# Patient Record
Sex: Female | Born: 1945 | Race: White | Hispanic: No | Marital: Single | Smoking: Former smoker
Health system: Southern US, Community
[De-identification: ages and names within clinical notes are randomized; demographics above are authoritative.]

## PROBLEM LIST (undated history)

## (undated) DIAGNOSIS — J45909 Unspecified asthma, uncomplicated: Secondary | ICD-10-CM

## (undated) DIAGNOSIS — Z789 Other specified health status: Secondary | ICD-10-CM

## (undated) DIAGNOSIS — Z973 Presence of spectacles and contact lenses: Secondary | ICD-10-CM

## (undated) DIAGNOSIS — Z923 Personal history of irradiation: Secondary | ICD-10-CM

## (undated) DIAGNOSIS — I499 Cardiac arrhythmia, unspecified: Secondary | ICD-10-CM

## (undated) DIAGNOSIS — M1990 Unspecified osteoarthritis, unspecified site: Secondary | ICD-10-CM

## (undated) DIAGNOSIS — R232 Flushing: Secondary | ICD-10-CM

## (undated) DIAGNOSIS — T7840XA Allergy, unspecified, initial encounter: Secondary | ICD-10-CM

## (undated) HISTORY — PX: COLONOSCOPY: SHX174

## (undated) HISTORY — PX: BREAST EXCISIONAL BIOPSY: SUR124

## (undated) HISTORY — PX: APPENDECTOMY: SHX54

## (undated) HISTORY — DX: Personal history of irradiation: Z92.3

## (undated) HISTORY — PX: TONSILLECTOMY AND ADENOIDECTOMY: SHX28

---
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
## 1996-01-31 HISTORY — PX: ABDOMINAL HYSTERECTOMY: SHX81

## 1998-09-23 ENCOUNTER — Encounter (INDEPENDENT_AMBULATORY_CARE_PROVIDER_SITE_OTHER): Payer: Self-pay | Admitting: Specialist

## 1998-09-23 ENCOUNTER — Other Ambulatory Visit: Admission: RE | Admit: 1998-09-23 | Discharge: 1998-09-23 | Payer: Self-pay | Admitting: Internal Medicine

## 1999-03-18 ENCOUNTER — Other Ambulatory Visit: Admission: RE | Admit: 1999-03-18 | Discharge: 1999-03-18 | Payer: Self-pay | Admitting: Obstetrics & Gynecology

## 2000-04-09 ENCOUNTER — Other Ambulatory Visit: Admission: RE | Admit: 2000-04-09 | Discharge: 2000-04-09 | Payer: Self-pay | Admitting: Obstetrics & Gynecology

## 2001-06-26 ENCOUNTER — Other Ambulatory Visit: Admission: RE | Admit: 2001-06-26 | Discharge: 2001-06-26 | Payer: Self-pay | Admitting: Obstetrics & Gynecology

## 2001-06-26 ENCOUNTER — Encounter: Admission: RE | Admit: 2001-06-26 | Discharge: 2001-06-26 | Payer: Self-pay | Admitting: Obstetrics & Gynecology

## 2001-06-26 ENCOUNTER — Encounter: Payer: Self-pay | Admitting: Obstetrics & Gynecology

## 2002-06-27 ENCOUNTER — Other Ambulatory Visit: Admission: RE | Admit: 2002-06-27 | Discharge: 2002-06-27 | Payer: Self-pay | Admitting: Obstetrics & Gynecology

## 2003-04-07 ENCOUNTER — Ambulatory Visit (HOSPITAL_COMMUNITY): Admission: RE | Admit: 2003-04-07 | Discharge: 2003-04-07 | Payer: Self-pay | Admitting: Obstetrics & Gynecology

## 2003-05-21 ENCOUNTER — Encounter (INDEPENDENT_AMBULATORY_CARE_PROVIDER_SITE_OTHER): Payer: Self-pay | Admitting: *Deleted

## 2003-05-21 ENCOUNTER — Ambulatory Visit (HOSPITAL_BASED_OUTPATIENT_CLINIC_OR_DEPARTMENT_OTHER): Admission: RE | Admit: 2003-05-21 | Discharge: 2003-05-21 | Payer: Self-pay | Admitting: Plastic Surgery

## 2004-05-13 ENCOUNTER — Ambulatory Visit (HOSPITAL_COMMUNITY): Admission: RE | Admit: 2004-05-13 | Discharge: 2004-05-13 | Payer: Self-pay | Admitting: Obstetrics & Gynecology

## 2005-01-10 ENCOUNTER — Ambulatory Visit: Payer: Self-pay | Admitting: Internal Medicine

## 2005-01-16 ENCOUNTER — Ambulatory Visit: Payer: Self-pay | Admitting: Internal Medicine

## 2005-06-02 ENCOUNTER — Ambulatory Visit (HOSPITAL_COMMUNITY): Admission: RE | Admit: 2005-06-02 | Discharge: 2005-06-02 | Payer: Self-pay | Admitting: Obstetrics & Gynecology

## 2006-02-16 ENCOUNTER — Ambulatory Visit: Payer: Self-pay | Admitting: Internal Medicine

## 2006-02-16 LAB — CONVERTED CEMR LAB
ALT: 29 units/L (ref 0–40)
BUN: 10 mg/dL (ref 6–23)
Basophils Absolute: 0.1 10*3/uL (ref 0.0–0.1)
Cholesterol: 213 mg/dL (ref 0–200)
Creatinine, Ser: 0.8 mg/dL (ref 0.4–1.2)
Direct LDL: 139.4 mg/dL
Eosinophils Relative: 2.5 % (ref 0.0–5.0)
Free T4: 0.8 ng/dL (ref 0.6–1.6)
GFR calc non Af Amer: 78 mL/min
Hgb A1c MFr Bld: 5.6 % (ref 4.6–6.0)
Monocytes Absolute: 0.5 10*3/uL (ref 0.2–0.7)
Monocytes Relative: 7.8 % (ref 3.0–11.0)
Neutro Abs: 3.2 10*3/uL (ref 1.4–7.7)
Neutrophils Relative %: 52.7 % (ref 43.0–77.0)
Platelets: 327 10*3/uL (ref 150–400)
Potassium: 4 meq/L (ref 3.5–5.1)
RDW: 12.6 % (ref 11.5–14.6)
T3, Free: 3 pg/mL (ref 2.3–4.2)
Total Bilirubin: 0.8 mg/dL (ref 0.3–1.2)
Total Protein: 7 g/dL (ref 6.0–8.3)
Triglycerides: 181 mg/dL — ABNORMAL HIGH (ref 0–149)
VLDL: 36 mg/dL (ref 0–40)
Vit D, 1,25-Dihydroxy: 17 — ABNORMAL LOW (ref 20–57)

## 2006-06-05 ENCOUNTER — Ambulatory Visit (HOSPITAL_COMMUNITY): Admission: RE | Admit: 2006-06-05 | Discharge: 2006-06-05 | Payer: Self-pay | Admitting: Internal Medicine

## 2006-06-11 ENCOUNTER — Emergency Department (HOSPITAL_COMMUNITY): Admission: EM | Admit: 2006-06-11 | Discharge: 2006-06-11 | Payer: Self-pay | Admitting: Emergency Medicine

## 2006-06-11 DIAGNOSIS — E7800 Pure hypercholesterolemia, unspecified: Secondary | ICD-10-CM

## 2006-06-11 DIAGNOSIS — Z8679 Personal history of other diseases of the circulatory system: Secondary | ICD-10-CM | POA: Insufficient documentation

## 2006-06-11 DIAGNOSIS — I7300 Raynaud's syndrome without gangrene: Secondary | ICD-10-CM | POA: Insufficient documentation

## 2006-06-11 DIAGNOSIS — M1990 Unspecified osteoarthritis, unspecified site: Secondary | ICD-10-CM | POA: Insufficient documentation

## 2006-06-13 ENCOUNTER — Encounter: Payer: Self-pay | Admitting: Internal Medicine

## 2006-06-13 ENCOUNTER — Ambulatory Visit: Payer: Self-pay | Admitting: Internal Medicine

## 2007-01-10 ENCOUNTER — Ambulatory Visit: Payer: Self-pay | Admitting: Internal Medicine

## 2007-06-17 ENCOUNTER — Ambulatory Visit (HOSPITAL_COMMUNITY): Admission: RE | Admit: 2007-06-17 | Discharge: 2007-06-17 | Payer: Self-pay | Admitting: Internal Medicine

## 2007-07-01 ENCOUNTER — Ambulatory Visit: Payer: Self-pay | Admitting: Internal Medicine

## 2008-01-31 HISTORY — PX: BREAST SURGERY: SHX581

## 2008-05-18 ENCOUNTER — Ambulatory Visit: Payer: Self-pay | Admitting: Internal Medicine

## 2008-05-18 DIAGNOSIS — H9190 Unspecified hearing loss, unspecified ear: Secondary | ICD-10-CM | POA: Insufficient documentation

## 2008-05-18 DIAGNOSIS — R42 Dizziness and giddiness: Secondary | ICD-10-CM | POA: Insufficient documentation

## 2008-05-19 ENCOUNTER — Encounter: Payer: Self-pay | Admitting: Internal Medicine

## 2008-06-18 ENCOUNTER — Ambulatory Visit (HOSPITAL_COMMUNITY): Admission: RE | Admit: 2008-06-18 | Discharge: 2008-06-18 | Payer: Self-pay | Admitting: Internal Medicine

## 2008-06-24 ENCOUNTER — Telehealth (INDEPENDENT_AMBULATORY_CARE_PROVIDER_SITE_OTHER): Payer: Self-pay | Admitting: *Deleted

## 2008-06-24 ENCOUNTER — Encounter: Admission: RE | Admit: 2008-06-24 | Discharge: 2008-06-24 | Payer: Self-pay | Admitting: Internal Medicine

## 2008-06-25 ENCOUNTER — Encounter: Payer: Self-pay | Admitting: Internal Medicine

## 2008-06-26 ENCOUNTER — Encounter: Payer: Self-pay | Admitting: Internal Medicine

## 2008-06-26 ENCOUNTER — Encounter: Admission: RE | Admit: 2008-06-26 | Discharge: 2008-06-26 | Payer: Self-pay | Admitting: Internal Medicine

## 2008-07-22 ENCOUNTER — Ambulatory Visit: Payer: Self-pay | Admitting: Genetic Counselor

## 2008-12-21 ENCOUNTER — Encounter: Admission: RE | Admit: 2008-12-21 | Discharge: 2008-12-21 | Payer: Self-pay | Admitting: Surgery

## 2009-02-15 ENCOUNTER — Ambulatory Visit (HOSPITAL_COMMUNITY): Admission: RE | Admit: 2009-02-15 | Discharge: 2009-02-15 | Payer: Self-pay | Admitting: Surgery

## 2009-02-15 ENCOUNTER — Encounter: Admission: RE | Admit: 2009-02-15 | Discharge: 2009-02-15 | Payer: Self-pay | Admitting: Surgery

## 2009-03-15 ENCOUNTER — Ambulatory Visit: Payer: Self-pay | Admitting: Family

## 2009-03-16 ENCOUNTER — Telehealth (INDEPENDENT_AMBULATORY_CARE_PROVIDER_SITE_OTHER): Payer: Self-pay | Admitting: *Deleted

## 2009-03-18 ENCOUNTER — Ambulatory Visit: Payer: Self-pay | Admitting: Internal Medicine

## 2009-03-18 DIAGNOSIS — J069 Acute upper respiratory infection, unspecified: Secondary | ICD-10-CM

## 2009-03-18 DIAGNOSIS — R946 Abnormal results of thyroid function studies: Secondary | ICD-10-CM | POA: Insufficient documentation

## 2009-03-18 DIAGNOSIS — R000 Tachycardia, unspecified: Secondary | ICD-10-CM

## 2009-03-22 LAB — CONVERTED CEMR LAB
Basophils Absolute: 0 10*3/uL (ref 0.0–0.1)
Eosinophils Absolute: 0.3 10*3/uL (ref 0.0–0.7)
HCT: 42.4 % (ref 36.0–46.0)
Neutro Abs: 5.5 10*3/uL (ref 1.4–7.7)
T3, Free: 2.7 pg/mL (ref 2.3–4.2)
TSH: 1.42 microintl units/mL (ref 0.35–5.50)
WBC: 9.8 10*3/uL (ref 4.5–10.5)

## 2010-03-01 NOTE — Progress Notes (Signed)
Summary: **TRIAGE**SEVERE HEADACHE  Phone Note Call from Patient Call back at Home Phone [phone]   Caller: Patient Call For: [name] MD Reason for Call: Talk to Nurse Complaint: Headache Summary of Call: PATIENT CALLING C/O OF MOST SEVERE HEADACHE OF HER LIFE.  WAS HERE YESTERDAY, 03-15-2009, AND SEEN BY MELISSA O'SULLIVAN FOR SINUS.  MELISSA FOUND PT TO HAVE A VERY FAST HEART RATE.  PATIENT NOT EXPERIENCING ANY NUMBNESS, TINGLING, FOR VISUAL CHANGES, BUT IS VERY CONCERNED.  PLEASE CALL PATIENT. Initial call taken by: Magdalen Spatz [facility],  March 16, 2009 1:47 PM  Follow-up for Phone Call        Noted phone note from yesterday.  I called patient to see how she is feeling.  She reports resolution of headache.  Denies neck stiffness, feeling  better.  Reports temp 99.0 yesterday.   I discussed antibiotics with Dr. Alwyn Ren, will plan to continue zithromax x 10 days total. Patient aware.  I advised patient to go to ER if neck stiffness or if severe HA.  She verbalized understanding.   Follow-up by: Lemont Fillers FNP,  March 17, 2009 8:20 AM  Additional Follow-up for Phone Call Additional follow up Details #1::        Chemira, could you please call patient and arrange a follow up visit for tomorrow with Dr. Alwyn Ren or myself?   Thanks Additional Follow-up by: Lemont Fillers FNP,  March 17, 2009 8:22 AM    Additional Follow-up for Phone Call Additional follow up Details #2::    Spoke with pt ov scheduled tomorrow with Dr Alwyn Ren .Kandice Hams  March 17, 2009 9:39 AM  Follow-up by: Kandice Hams,  March 17, 2009 9:39 AM  New/Updated Medications: ZITHROMAX 250 MG TABS (AZITHROMYCIN) one tablet by mouth daily x 5 more days after completion of z-pak. Prescriptions: ZITHROMAX 250 MG TABS (AZITHROMYCIN) one tablet by mouth daily x 5 more days after completion of z-pak.  #5 x 0   Entered by:   Lemont Fillers FNP   Authorized by:   [name] MD   Signed  by:   Lemont Fillers FNP on 03/17/2009   Method used:   Electronically to        [facility] # [address] (retail)       [address]       [place], [place]  [address]       Ph: [phone]       Fax: [phone]   RxID:   [id number]

## 2010-03-01 NOTE — Assessment & Plan Note (Signed)
Summary: followup headaches/alr   Vital Signs:  Patient profile:   64 year old female Weight:      159 pounds Temp:     99.0 degrees F oral Pulse rate:   124 / minute Resp:     15 per minute BP sitting:   126 / 70  (left arm) Cuff size:   large  Vitals Entered By: Shonna Chock (March 18, 2009 10:35 AM) CC: Follow-up visit: Discuss Headaches Comments REVIEWED MED LIST, PATIENT AGREED DOSE AND INSTRUCTION CORRECT    CC:  Follow-up visit: Discuss Headaches.  History of Present Illness: She returns for F/U of sinusitis ; she has congestion when supine. She uses Vick's in nose at bedtime .  She is unaware of tachycardia as noted 02/14 & today. Sudafed D/Ced 02/13; now on Robitussin CF.PMH of abnormal TFTs .  Allergies: 1)  ! Pcn 2)  ! Sulfa  Review of Systems General:  Complains of sweats; denies chills and fever. ENT:  No frontal headache, facial pain or purulence. CV:  Denies chest pain or discomfort and palpitations. Resp:  Complains of cough; denies shortness of breath, sputum productive, and wheezing. GI:  Complains of diarrhea and loss of appetite; denies constipation; watery stool in afternoon  since Zpack. Derm:  Denies changes in nail beds, dryness, and hair loss. Neuro:  Denies numbness and tingling. Endo:  Complains of heat intolerance; denies cold intolerance.  Physical Exam  General:  well-nourished,in no acute distress; alert,appropriate and cooperative throughout examination Eyes:  No lid lag Ears:  External ear exam shows no significant lesions or deformities.  Otoscopic examination reveals clear canals, tympanic membranes are intact bilaterally without bulging, retraction, inflammation or discharge. Hearing is grossly normal bilaterally. Nose:  External nasal examination shows no deformity or inflammation. Nasal mucosa are pink and moist without lesions or exudates. Mouth:  Oral mucosa and oropharynx without lesions or exudates.  Teeth in good repair.  Slightly hoarse Neck:  No deformities, masses, or tenderness noted. Lungs:  Normal respiratory effort, chest expands symmetrically. Lungs are clear to auscultation, no crackles or wheezes. Heart:  Normal rate and regular rhythm. S1 and S2 normal without gallop, murmur, click, rub . S4 with pulse 90-102 Neurologic:  DTRs symmetrical and normal.  Fine tremor. Skin:  Intact without suspicious lesions or rashes Cervical Nodes:  No lymphadenopathy noted Axillary Nodes:  No palpable lymphadenopathy Psych:  memory intact for recent and remote, normally interactive, good eye contact, and not anxious appearing.     Impression & Recommendations:  Problem # 1:  URI (ICD-465.9)   Nasal congestion when supine   Orders: TLB-CBC Platelet - w/Differential (85025-CBCD)  Her updated medication list for this problem includes:    Promethegan 25 Mg Supp (Promethazine hcl) .Marland Kitchen... 1 q 6 -8 hrs as needed  Problem # 2:  TACHYCARDIA (ICD-785.0)  mild, ? drug induced  Orders: Venipuncture (29562)  Problem # 3:  THYROID FUNCTION TEST, ABNORMAL (ICD-794.5)  Orders: Venipuncture (13086) TLB-TSH (Thyroid Stimulating Hormone) (84443-TSH) TLB-T3, Free (Triiodothyronine) (84481-T3FREE)  Problem # 4:  DIARRHEA (ICD-787.91)  Orders: Venipuncture (57846) TLB-CBC Platelet - w/Differential (85025-CBCD)  Complete Medication List: 1)  Zithromax Z-pak 250 Mg Tabs (Azithromycin) .... Two tabs by mouth x 1 today, then one tablet by mouth daily for 4 more days 2)  Zithromax 250 Mg Tabs (Azithromycin) .... One tablet by mouth daily x 5 more days after completion of z-pak. 3)  Fluticasone Propionate 50 Mcg/act Susp (Fluticasone propionate) .Marland Kitchen.. 1 spray two times  a day as "crossover " technique 4)  Meclizine Hcl 25 Mg Tabs (Meclizine hcl) .Marland Kitchen.. 1 q 6-8 hrs as needed 5)  Promethegan 25 Mg Supp (Promethazine hcl) .Marland Kitchen.. 1 q 6 -8 hrs as needed  Patient Instructions: 1)  Neti pot once daily until sinuses  are clear. 2)   Drink as much fluid as you can tolerate for the next few days. Nasal sray two times a day as Rxed. Stop Vicks @ night. Align once daily until bowels are normal. 3)  Medically cleared to return to work 03/19/2009; return to work note provided. Prescriptions: PROMETHEGAN 25 MG SUPP (PROMETHAZINE HCL) 1 q 6 -8 hrs as needed  #6 x 0   Entered and Authorized by:   [name] MD   Signed by:   [name] MD on 03/18/2009   Method used:   Print then Give to Patient   RxID:   [id number] MECLIZINE HCL 25 MG TABS (MECLIZINE HCL) 1 q 6-8 hrs as needed  #30 x 0   Entered and Authorized by:   [name] MD   Signed by:   [name] MD on 03/18/2009   Method used:   Print then Give to Patient   RxID:   [id number] FLUTICASONE PROPIONATE 50 MCG/ACT SUSP (FLUTICASONE PROPIONATE) 1 spray two times a day as "crossover " technique  #1 x 5   Entered and Authorized by:   [name] MD   Signed by:   [name] MD on 03/18/2009   Method used:   Faxed to ...       [facility] # [address]* (retail)       [address]       [place], [place]  [address]       Ph: [phone]       Fax: [phone]   RxID:   [id number]

## 2010-03-01 NOTE — Assessment & Plan Note (Signed)
Summary: ? SINUS INF/RH.......Marland Kitchen   Vital Signs:  Patient profile:   65 year old female Weight:      159 pounds Temp:     99.1 degrees F oral BP sitting:   112 / 70  (left arm)  Vitals Entered By: Doristine Devoid (March 15, 2009 11:22 AM) CC: sinus infection? having some facial pain and pressure has taken sudafed w/ little relief    CC:  sinus infection? having some facial pain and pressure has taken sudafed w/ little relief .  History of Present Illness: Ms Beste is a 65 year old female who present with c/o HA on Thursday.  Friday she woke with bad sore throat.  Notes that Yesterday her throat was so sore, felt like it was going to close up on me.  Has been doing warm water gargles. Sore throat is gone.  Notes HA is almost constant and is localized to the frontal sinus region.  Notes + creme colored nasal discharge.  Notes low grade fever.  She feels that her pain is greatest on the left side of her face.    Allergies: 1)  ! Pcn 2)  ! Sulfa  Physical Exam  General:  Well-developed,well-nourished,in no acute distress; alert,appropriate and cooperative throughout examination Head:  Normocephalic and atraumatic without obvious abnormalities. No apparent alopecia or balding. Eyes:  PERRLA Ears:  External ear exam shows no significant lesions or deformities.  Otoscopic examination reveals clear canals, tympanic membranes are intact bilaterally without bulging, retraction, inflammation or discharge. Hearing is grossly normal bilaterally. Mouth:  mild pharyngeal erythema without exudate.  No narrowing of posterior pharynx noted.   Neck:  No deformities, masses, or tenderness noted. Lungs:  Normal respiratory effort, chest expands symmetrically. Lungs are clear to auscultation, no crackles or wheezes. Heart:  + mild tachycardia, (heart rate 124), s1/s2, regular rythm Cervical Nodes:  No lymphadenopathy noted   Impression & Recommendations:  Problem # 1:  SINUSITIS  (ICD-473.9) Assessment New Will plan to treat with zithromax.  Patient with low grade temperature and mild tachycardia.  Rapid flu is negative. Lung exam benign.  Will need close follow up.    I have instructed patient to call if you develop fever over 101, increasing sinus pressure, pain with eye movement, increased facial tenderness of swelling, or if  visual changes.  She verbalizes understanding.  Follow up in 1 week- sooner if needed.    Her updated medication list for this problem includes:    Zithromax Z-pak 250 Mg Tabs (Azithromycin) .Marland Kitchen..Marland Kitchen Two tabs by mouth x 1 today, then one tablet by mouth daily for 4 more days  Complete Medication List: 1)  Sudafed  .... Prn 2)  Zithromax Z-pak 250 Mg Tabs (Azithromycin) .... Two tabs by mouth x 1 today, then one tablet by mouth daily for 4 more days  Patient Instructions: 1)   Call if you develop fever over 101, increasing sinus pressure, pain with eye movement, increased facial tenderness of swelling, or if you develop visual changes. 2)  Follow up in 1 week, sooner if problems or concerns.   Prescriptions: ZITHROMAX Z-PAK 250 MG TABS (AZITHROMYCIN) two tabs by mouth x 1 today, then one tablet by mouth daily for 4 more days  #1 pack x 0   Entered and Authorized by:   Lemont Fillers FNP   Signed by:   Lemont Fillers FNP on 03/15/2009   Method used:   Electronically to        [facility] # [address] (retail)       [address]       [place], [place]  [address]       Ph: [phone]       Fax: [phone]   RxID:   [id number]   Laboratory Results    Other Tests  Influenza A: negative

## 2010-04-04 ENCOUNTER — Other Ambulatory Visit: Payer: Self-pay | Admitting: Internal Medicine

## 2010-04-04 DIAGNOSIS — Z1231 Encounter for screening mammogram for malignant neoplasm of breast: Secondary | ICD-10-CM

## 2010-04-17 LAB — DIFFERENTIAL
Basophils Absolute: 0 10*3/uL (ref 0.0–0.1)
Basophils Relative: 0 % (ref 0–1)
Eosinophils Absolute: 0.2 10*3/uL (ref 0.0–0.7)
Lymphs Abs: 3.2 10*3/uL (ref 0.7–4.0)
Monocytes Absolute: 0.6 10*3/uL (ref 0.1–1.0)
Monocytes Relative: 8 % (ref 3–12)

## 2010-04-17 LAB — CBC
MCHC: 35.3 g/dL (ref 30.0–36.0)
MCV: 93.6 fL (ref 78.0–100.0)
Platelets: 308 10*3/uL (ref 150–400)
RBC: 4.48 MIL/uL (ref 3.87–5.11)
RDW: 13.2 % (ref 11.5–15.5)
WBC: 7.9 10*3/uL (ref 4.0–10.5)

## 2010-04-17 LAB — BASIC METABOLIC PANEL
BUN: 12 mg/dL (ref 6–23)
Calcium: 10.7 mg/dL — ABNORMAL HIGH (ref 8.4–10.5)
GFR calc non Af Amer: >60 mL/min (ref >60)
Glucose, Bld: 100 mg/dL — ABNORMAL HIGH (ref 70–99)
Potassium: 4.6 mEq/L (ref 3.5–5.1)

## 2010-05-06 ENCOUNTER — Ambulatory Visit
Admission: RE | Admit: 2010-05-06 | Discharge: 2010-05-06 | Disposition: A | Discharge status: Home / Self Care | Payer: BC Managed Care – PPO | Source: Ambulatory Visit | Attending: Internal Medicine | Admitting: Internal Medicine

## 2010-05-06 DIAGNOSIS — Z1231 Encounter for screening mammogram for malignant neoplasm of breast: Secondary | ICD-10-CM

## 2011-01-02 ENCOUNTER — Telehealth: Payer: Self-pay | Admitting: Internal Medicine

## 2011-01-02 NOTE — Telephone Encounter (Signed)
Patient call with alt # to call [phone] --- # she gave was work #

## 2011-01-02 NOTE — Telephone Encounter (Signed)
Pt schedule to come in tomorrow.

## 2011-01-03 ENCOUNTER — Ambulatory Visit (INDEPENDENT_AMBULATORY_CARE_PROVIDER_SITE_OTHER): Payer: BC Managed Care – PPO | Admitting: Internal Medicine

## 2011-01-03 ENCOUNTER — Encounter: Payer: Self-pay | Admitting: Internal Medicine

## 2011-01-03 VITALS — BP 114/70 | HR 102 | Temp 98.4°F | Ht 62.75 in | Wt 166.4 lb

## 2011-01-03 DIAGNOSIS — H8110 Benign paroxysmal vertigo, unspecified ear: Secondary | ICD-10-CM

## 2011-01-03 DIAGNOSIS — G479 Sleep disorder, unspecified: Secondary | ICD-10-CM

## 2011-01-03 DIAGNOSIS — N951 Menopausal and female climacteric states: Secondary | ICD-10-CM

## 2011-01-03 DIAGNOSIS — H9209 Otalgia, unspecified ear: Secondary | ICD-10-CM

## 2011-01-03 DIAGNOSIS — H9201 Otalgia, right ear: Secondary | ICD-10-CM

## 2011-01-03 MED ORDER — NEOMYCIN-POLYMYXIN-HC 3.5-10000-1 OT SUSP: 3.0000 [drp] | Freq: Four times a day (QID) | OTIC | Status: AC

## 2011-01-03 MED ORDER — FLUTICASONE PROPIONATE 50 MCG/ACT NA SUSP: NASAL | Status: DC

## 2011-01-03 MED ORDER — CLONIDINE HCL 0.1 MG PO TABS: ORAL_TABLET | ORAL | Status: DC

## 2011-01-03 NOTE — Patient Instructions (Addendum)
To prevent sleep dysfunction follow these instructions for sleep hygiene. Do not read, watch TV, or eat in bed. Do not get into bed until you are ready to turn off the light &  to go to sleep. Do not ingest stimulants ( decongestants, diet pills, nicotine, caffeine) after the evening meal. The medication for flashes may cause dry mouth or dry eyes; this would resolve after 48-72 hours maximally . It also may lower the blood pressure slightly ; do isometric exercises before standing up at night to the bathroom for example.

## 2011-01-03 NOTE — Progress Notes (Signed)
  Subjective:    Patient ID: Kathleen Harris, female    DOB: 1945-03-22, 65 y.o.   MRN: [id number]  HPI Earache: Onset/symptoms:2 weeks ago she felt pressure in R ear Exposures (illness/environmental/extrinsic):water while shampooing hair Progression of symptoms:to vertiginous symptoms with standing up from waist flexion Treatments/response:none except shower cap Present symptoms: Fever/chills/sweats:only menopausal sweats Frontal headache:yes Facial pain:yes Nasal purulence:scant yellow Ear discharge: orange Sore throat:yes Dental pain:no Lymphadenopathy:no Wheezing/shortness of breath:no Cough/sputum/hemoptysis:no Past medical history: Seasonal allergies; yes /asthma:only as child Smoking history:quit 1973          Review of Systems   She has frequent awakenings related to hot flashes.     Objective:   Physical Exam General appearance is of good health and nourishment; no acute distress or increased work of breathing is present.  No  lymphadenopathy about the head, neck, or axilla noted.   Eyes: No conjunctival inflammation or lid edema is present. EOMI w/o nystagmus  Ears:  External ear exam shows no significant lesions or deformities.  Otoscopic examination reveals clear canals,R  tympanic membrane intact  without bulging, retraction, inflammation or discharge; but it is dull. L TM normal  Nose:  External nasal examination shows no deformity or inflammation. Nasal mucosa are pink and moist without lesions or exudates. No septal dislocation .No obstruction to airflow.   Oral exam: Dental hygiene is good; lips and gums are healthy appearing.There is no oropharyngeal erythema or exudate noted.   Heart:  Normal rate and regular rhythm. S2 accentuated  without gallop, murmur, click, rub or other extra sounds.   Lungs:Chest clear to auscultation; no wheezes, rhonchi,rales ,or rubs present.No increased work of breathing.    Extremities:  No cyanosis or clubbing  noted ;  trace edema   Skin: Warm & dry           Assessment & Plan:  #1 chronic recurrent otic dysfunction probably related to eustachian tube dysfunction. This is associated with benign positional vertigo symptoms.  #2 possible upper respiratory tract symptoms; rule out rhinosinusitis.  #3, menopausal related  sleep dysfunction  Plan: See orders and recommendations

## 2011-01-20 ENCOUNTER — Other Ambulatory Visit: Payer: Self-pay | Admitting: Internal Medicine

## 2011-04-04 ENCOUNTER — Other Ambulatory Visit: Payer: Self-pay | Admitting: Internal Medicine

## 2011-04-04 DIAGNOSIS — Z1231 Encounter for screening mammogram for malignant neoplasm of breast: Secondary | ICD-10-CM

## 2011-05-19 ENCOUNTER — Ambulatory Visit
Admission: RE | Admit: 2011-05-19 | Discharge: 2011-05-19 | Disposition: A | Discharge status: Home / Self Care | Payer: BC Managed Care – PPO | Source: Ambulatory Visit | Attending: Internal Medicine | Admitting: Internal Medicine

## 2011-05-19 DIAGNOSIS — Z1231 Encounter for screening mammogram for malignant neoplasm of breast: Secondary | ICD-10-CM

## 2011-11-22 ENCOUNTER — Encounter: Payer: BC Managed Care – PPO | Admitting: Internal Medicine

## 2011-11-29 ENCOUNTER — Ambulatory Visit: Payer: BC Managed Care – PPO | Admitting: Internal Medicine

## 2011-12-26 ENCOUNTER — Encounter: Payer: BC Managed Care – PPO | Admitting: Internal Medicine

## 2012-01-31 DIAGNOSIS — Z923 Personal history of irradiation: Secondary | ICD-10-CM

## 2012-01-31 DIAGNOSIS — C50919 Malignant neoplasm of unspecified site of unspecified female breast: Secondary | ICD-10-CM

## 2012-01-31 HISTORY — DX: Malignant neoplasm of unspecified site of unspecified female breast: C50.919

## 2012-01-31 HISTORY — DX: Personal history of irradiation: Z92.3

## 2012-01-31 HISTORY — PX: BREAST LUMPECTOMY: SHX2

## 2012-04-17 ENCOUNTER — Other Ambulatory Visit: Payer: Self-pay

## 2012-04-17 DIAGNOSIS — Z1231 Encounter for screening mammogram for malignant neoplasm of breast: Secondary | ICD-10-CM

## 2012-05-21 ENCOUNTER — Ambulatory Visit
Admission: RE | Admit: 2012-05-21 | Discharge: 2012-05-21 | Disposition: A | Discharge status: Home / Self Care | Payer: BC Managed Care – PPO | Source: Ambulatory Visit

## 2012-05-21 DIAGNOSIS — Z1231 Encounter for screening mammogram for malignant neoplasm of breast: Secondary | ICD-10-CM

## 2012-05-22 ENCOUNTER — Other Ambulatory Visit (INDEPENDENT_AMBULATORY_CARE_PROVIDER_SITE_OTHER): Payer: Self-pay | Admitting: Surgery

## 2012-05-22 DIAGNOSIS — R928 Other abnormal and inconclusive findings on diagnostic imaging of breast: Secondary | ICD-10-CM

## 2012-06-03 ENCOUNTER — Other Ambulatory Visit: Payer: BC Managed Care – PPO

## 2012-06-04 ENCOUNTER — Other Ambulatory Visit (INDEPENDENT_AMBULATORY_CARE_PROVIDER_SITE_OTHER): Payer: Self-pay | Admitting: Surgery

## 2012-06-04 ENCOUNTER — Ambulatory Visit
Admission: RE | Admit: 2012-06-04 | Discharge: 2012-06-04 | Disposition: A | Discharge status: Home / Self Care | Payer: BC Managed Care – PPO | Source: Ambulatory Visit | Attending: Surgery | Admitting: Surgery

## 2012-06-04 DIAGNOSIS — R928 Other abnormal and inconclusive findings on diagnostic imaging of breast: Secondary | ICD-10-CM

## 2012-06-05 ENCOUNTER — Other Ambulatory Visit (INDEPENDENT_AMBULATORY_CARE_PROVIDER_SITE_OTHER): Payer: Self-pay | Admitting: Surgery

## 2012-06-05 ENCOUNTER — Ambulatory Visit
Admission: RE | Admit: 2012-06-05 | Discharge: 2012-06-05 | Disposition: A | Discharge status: Home / Self Care | Payer: BC Managed Care – PPO | Source: Ambulatory Visit | Attending: Surgery | Admitting: Surgery

## 2012-06-05 ENCOUNTER — Encounter: Payer: Self-pay | Admitting: Internal Medicine

## 2012-06-05 DIAGNOSIS — C50911 Malignant neoplasm of unspecified site of right female breast: Secondary | ICD-10-CM

## 2012-06-05 DIAGNOSIS — R928 Other abnormal and inconclusive findings on diagnostic imaging of breast: Secondary | ICD-10-CM

## 2012-06-10 ENCOUNTER — Ambulatory Visit
Admission: RE | Admit: 2012-06-10 | Discharge: 2012-06-10 | Disposition: A | Discharge status: Home / Self Care | Payer: BC Managed Care – PPO | Source: Ambulatory Visit | Attending: Surgery | Admitting: Surgery

## 2012-06-10 ENCOUNTER — Other Ambulatory Visit: Payer: BC Managed Care – PPO

## 2012-06-10 DIAGNOSIS — C50911 Malignant neoplasm of unspecified site of right female breast: Secondary | ICD-10-CM

## 2012-06-10 MED ORDER — GADOBENATE DIMEGLUMINE 529 MG/ML IV SOLN: 15.0000 mL | Freq: Once | INTRAVENOUS | Status: AC | PRN

## 2012-06-10 MED ORDER — GADOBENATE DIMEGLUMINE 529 MG/ML IV SOLN
15.0000 mL | Freq: Once | INTRAVENOUS | Status: AC | PRN
  Administered 2012-06-10: 15 mL via INTRAVENOUS

## 2012-06-13 ENCOUNTER — Telehealth (INDEPENDENT_AMBULATORY_CARE_PROVIDER_SITE_OTHER): Payer: Self-pay

## 2012-06-13 NOTE — Telephone Encounter (Signed)
Patient called in stating she had not heard back from our office regarding her appointment being moved up. She says the breast center told her I would call her and move her appointment up. I told her the breast center made a mistake in telling her that because Dr Luisa Hart is not in the office the rest of the week. She asked why we did not call her with MRI results. I told her that the breast center should have called her with those results because we did not order it so the results did not come to our in basket. I advised her to call the breast center for the results and Dr Luisa Hart would consult with her on Monday.

## 2012-06-17 ENCOUNTER — Encounter (INDEPENDENT_AMBULATORY_CARE_PROVIDER_SITE_OTHER): Payer: Self-pay | Admitting: Surgery

## 2012-06-17 ENCOUNTER — Ambulatory Visit (INDEPENDENT_AMBULATORY_CARE_PROVIDER_SITE_OTHER): Payer: BC Managed Care – PPO | Admitting: Surgery

## 2012-06-17 VITALS — BP 110/70 | HR 88 | Temp 97.8°F | Resp 18 | Ht 63.0 in | Wt 160.2 lb

## 2012-06-17 DIAGNOSIS — C50919 Malignant neoplasm of unspecified site of unspecified female breast: Secondary | ICD-10-CM

## 2012-06-17 DIAGNOSIS — C50911 Malignant neoplasm of unspecified site of right female breast: Secondary | ICD-10-CM

## 2012-06-17 NOTE — Patient Instructions (Signed)
Sentinel Lymph Node Biopsy Sentinel lymph node biopsy is a procedure in which a single lymph node is identified, removed, and examined for cancer. Lymph nodes are collections of tissue that help filter infections, cancer cells, and other waste substances from the bloodstream. Certain types of cancer can spread to nearby lymph nodes. The cancer spreads to one lymph node first, and then to others. The first lymph node that your cancer could spread to is called the sentinel lymph node. Examining the sentinel lymph node for cancer can help your caregiver plan future treatment for you. LET YOUR CAREGIVER KNOW ABOUT:   Allergies to food or medicine.  Medicines taken, including vitamins, herbs, eyedrops, over-the-counter medicines, and creams.  Use of steroids (by mouth or creams).  Previous problems with numbing medicines.  History of bleeding problems or blood clots.  Previous surgery.  Other health problems, including diabetes and kidney problems.  Possibility of pregnancy, if this applies. RISKS AND COMPLICATIONS   Infection.  Bleeding.  Allergic reaction to the dye used for the procedure.  Blue staining of the skin where the dye is injected.  Damaged lymph vessels, causing a buildup of fluid (lymphedema).  Pain or bruising at the biopsy site. BEFORE THE PROCEDURE   Stop smoking at least 2 weeks before the procedure. Not smoking will improve your health after the procedure and decrease the chance of getting a wound infection.  You may have blood tests to make sure your blood clots normally.  Ask your caregiver about changing or stopping your regular medicines.  Do not eat or drink anything for 8 hours before the procedure. PROCEDURE   You will be given medicine that makes you sleep (general anesthetic).  A blue, radioactive dye will be injected near the tumor. The dye will then spread into the sentinel lymph node.  A scanner will identify the sentinel lymph node.  A  small cut (incision) will be made, and the sentinel lymph node will be removed.  The sentinel lymph node will be examined in a lab. Sometimes, a sentinel lymph node biopsy is performed during another surgery, such as a mastectomy or lumpectomy for breast cancer.  AFTER THE PROCEDURE   You will go to a recovery room.  You will be monitored for several hours.  If complications do not occur, you will be allowed to go home a few hours after the procedure.  Your urine may be blue for the next 24 hours. This is normal. It is caused by the dye used during the procedure.  Your skin where the dye was injected may be blue for up to 8 weeks. Document Released: 04/10/2011 Document Reviewed: 04/10/2011 [facility] Patient Information 2013 [place], [place]. Lumpectomy, Breast Conserving Surgery A lumpectomy is breast surgery that removes only part of the breast. Another name used may be partial mastectomy. The amount removed varies. Make sure you understand how much of your breast will be removed. Reasons for a lumpectomy:  Any solid breast mass.  Grouped significant nodularity that may be confused with a solitary breast mass. Lumpectomy is the most common form of breast cancer surgery today. The surgeon removes the portion of your breast which contains the tumor (cancer). This is the lump. Some normal tissue around the lump is also removed to be sure that all the tumor has been removed.  If cancer cells are found in the margins where the breast tissue was removed, your surgeon will do more surgery to remove the remaining cancer tissue. This is called re-excision  surgery. Radiation and/or chemotherapy treatments are often given following a lumpectomy to kill any cancer cells that could possibly remain.  REASONS YOU MAY NOT BE ABLE TO HAVE BREAST CONSERVING SURGERY:  The tumor is located in more than one place.  Your breast is small and the tumor is large so the breast would be disfigured.  The entire  tumor removal is not successful with a lumpectomy.  You cannot commit to a full course of chemotherapy, radiation therapy or are pregnant and cannot have radiation.  You have previously had radiation to the breast to treat cancer. HOW A LUMPECTOMY IS PERFORMED If overnight nursing is not required following a biopsy, a lumpectomy can be performed as a same-day surgery. This can be done in a hospital, clinic, or surgical center. The anesthesia used will depend on your surgeon. They will discuss this with you. A general anesthetic keeps you sleeping through the procedure. LET YOUR CAREGIVERS KNOW ABOUT THE FOLLOWING:  Allergies  Medications taken including herbs, eye drops, over the counter medications, and creams.  Use of steroids (by mouth or creams)  Previous problems with anesthetics or Novocaine.  Possibility of pregnancy, if this applies  History of blood clots (thrombophlebitis)  History of bleeding or blood problems.  Previous surgery  Other health problems BEFORE THE PROCEDURE You should be present one hour prior to your procedure unless directed otherwise.  AFTER THE PROCEDURE  After surgery, you will be taken to the recovery area where a nurse will watch and check your progress. Once you're awake, stable, and taking fluids well, barring other problems you will be allowed to go home.  Ice packs applied to your operative site may help with discomfort and keep the swelling down.  A small rubber drain may be placed in the breast for a couple of days to prevent a hematoma from developing in the breast.  A pressure dressing may be applied for 24 to 48 hours to prevent bleeding.  Keep the wound dry.  You may resume a normal diet and activities as directed. Avoid strenuous activities affecting the arm on the side of the biopsy site such as tennis, swimming, heavy lifting (more than 10 pounds) or pulling.  Bruising in the breast is normal following this procedure.  Wearing  a bra - even to bed - may be more comfortable and also help keep the dressing on.  Change dressings as directed.  Only take over-the-counter or prescription medicines for pain, discomfort, or fever as directed by your caregiver. Call for your results as instructed by your surgeon. Remember it is your responsibility to get the results of your lumpectomy if your surgeon asked you to follow-up. Do not assume everything is fine if you have not heard from your caregiver. SEEK MEDICAL CARE IF:   There is increased bleeding (more than a small spot) from the wound.  You notice redness, swelling, or increasing pain in the wound.  Pus is coming from wound.  An unexplained oral temperature above 102 F (38.9 C) develops.  You notice a foul smell coming from the wound or dressing. SEEK IMMEDIATE MEDICAL CARE IF:   You develop a rash.  You have difficulty breathing.  You have any allergic problems. Document Released: 02/27/2006 Document Revised: 04/10/2011 Document Reviewed: 05/31/2006 [facility] Patient Information 2013 [place], [place].

## 2012-06-17 NOTE — Progress Notes (Signed)
Patient ID: Kathleen Harris, female   DOB: [birth year], 67 y.o.   MRN: [id number]  Chief Complaint  Patient presents with  . New Evaluation    eval Rt br ca    HPI Kathleen Harris is a 67 y.o. female.  Patient sent at the request of Dr. Jean Rosenthal of the breast Center [facility] do to right breast cancer. 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 Patient denies any symptoms of breast mass, breast pain or nipple discharge. Had previous breast biopsy in the left which was benign in 2011. HPI  History reviewed. No pertinent past medical history.  Past Surgical History  Procedure Laterality Date  . Tonsillectomy and adenoidectomy    . Appendectomy    . Breast surgery      Several lumectomies.  . Abdominal hysterectomy      Family History  Problem Relation Age of Onset  . Cancer Mother     Breast  . Cancer Father     Colon  . Cancer Sister     Breast  . Heart attack Brother     Social History History  Substance Use Topics  . Smoking status: Former Smoker    Types: Cigarettes    Quit date: 01/31/1971  . Smokeless tobacco: Never Used  . Alcohol Use: No    Allergies  Allergen Reactions  . Penicillins Rash  . Sulfonamide Derivatives Nausea Only    Current Outpatient Prescriptions  Medication Sig Dispense Refill  . Ascorbic Acid (VITAMIN C PO) Take by mouth.      Marland Kitchen aspirin 81 MG tablet Take 81 mg by mouth daily.      Marland Kitchen b complex vitamins tablet Take 1 tablet by mouth daily.      . Multiple Vitamins-Minerals (MULTIVITAMIN WITH MINERALS) tablet Take 1 tablet by mouth daily.       No current facility-administered medications for this visit.    Review of Systems Review of Systems  Constitutional: Negative for fever, chills and unexpected weight change.  HENT: Negative for hearing loss, congestion, sore  throat, trouble swallowing and voice change.   Eyes: Negative for visual disturbance.  Respiratory: Negative for cough and wheezing.   Cardiovascular: Negative for chest pain, palpitations and leg swelling.  Gastrointestinal: Negative for nausea, vomiting, abdominal pain, diarrhea, constipation, blood in stool, abdominal distention and anal bleeding.  Genitourinary: Negative for hematuria, vaginal bleeding and difficulty urinating.  Musculoskeletal: Negative for arthralgias.  Skin: Negative for rash and wound.  Neurological: Negative for seizures, syncope and headaches.  Hematological: Negative for adenopathy. Does not bruise/bleed easily.  Psychiatric/Behavioral: Negative for confusion.    Blood pressure 110/70, pulse 88, temperature 97.8 F (36.6 C), temperature source Temporal, resp. rate 18, height 5\' 3"  (1.6 m), weight 160 lb 3.2 oz (72.666 kg).  Physical Exam Physical Exam  Constitutional: She is oriented to person, place, and time. She appears well-developed and well-nourished.  HENT:  Head: Normocephalic and atraumatic.  Eyes: EOM are normal. Pupils are equal, round, and reactive to light.  Neck: Normal range of motion. Neck supple.  Cardiovascular: Normal rate and regular rhythm.   Pulmonary/Chest: Effort normal and breath sounds normal. Right breast exhibits no inverted nipple, no mass, no nipple discharge, no skin change and no tenderness. Left breast exhibits no inverted nipple, no mass, no nipple discharge, no skin change and no tenderness. Breasts are  symmetrical.    Musculoskeletal: Normal range of motion.  Lymphadenopathy:    She has no cervical adenopathy.  Neurological: She is alert and oriented to person, place, and time.  Skin: Skin is warm and dry.  Psychiatric: She has a normal mood and affect. Her behavior is normal. Judgment and thought content normal.    Data Reviewed Clinical Data: Recently diagnosed right breast invasive ductal  carcinoma. Preoperative  evaluation.  BUN and creatinine were obtained on site at [facility] at  [address]  Results: BUN 8 mg/dL, Creatinine 0.9 mg/dL.  BILATERAL BREAST MRI WITH AND WITHOUT CONTRAST  Technique: Multiplanar, multisequence MR images of both breasts  were obtained prior to and following the intravenous administration  of 15ml of Multihance. Three dimensional images were evaluated at  the independent DynaCad workstation.  Comparison: Mammograms dated 05/21/2012, 06/04/2012, 05/19/2011  and ultrasound dated 06/04/2012.  Findings: There is an irregular enhancing mass with central clip  artifact located within the upper-outer quadrant of the right  breast at the 10 o'clock position (middle one third). This  measures 1.4 x 0.9 x 0.8 cm in size. This is associated with  plateau type enhancement kinetics. There are no additional  worrisome enhancing foci within either breast. There is no  evidence for axillary or internal mammary adenopathy. There is a 5  mm circumscribed lesion within the anterior right lobe of liver  which is bright on the inversion recovery and T2 sequences most  consistent with a small incidental cyst or hemangioma. There are  no additional findings.  IMPRESSION:  1. 1.4 cm irregular enhancing mass located within the upper-outer  quadrant of the right breast (middle third) corresponding to the  recently diagnosed invasive ductal carcinoma.  2. 5 mm probable incidental cyst versus small hemangioma located  within the anterior right lobe of the liver that as discussed  above.   Assessment    Stage 1 right breast cancer ER/PR positive Her 2 neu negative    Plan    Patient to proceed with right breast conservation. Discuss right breast lumpectomy with sentinel lymph node mapping. Risks, benefits and long term expectations discussed. Adjuvant therapy discussed. Mastectomy discussed as well. She would like to proceed with right breast lumpectomy with sentinel  lymph node mapping and wishes to have a small mole removed from her right breast at the same time.The procedure has been discussed with the patient. Alternatives to surgery have been discussed with the patient.  Risks of surgery include bleeding,  Infection,  Seroma formation, death,  and the need for further surgery.   The patient understands and wishes to proceed.Sentinel lymph node mapping and dissection has been discussed with the patient.  Risk of bleeding,  Infection,  Seroma formation,  Additional procedures,,  Shoulder weakness ,  Shoulder stiffness,  Nerve and blood vessel injury and reaction to the mapping dyes have been discussed.  Alternatives to surgery have been discussed with the patient.  The patient agrees to proceed.       Ineze Serrao A. 06/17/2012, 10:41 AM

## 2012-06-18 ENCOUNTER — Encounter (HOSPITAL_BASED_OUTPATIENT_CLINIC_OR_DEPARTMENT_OTHER): Payer: Self-pay | Admitting: *Deleted

## 2012-06-18 NOTE — Progress Notes (Signed)
No labs needed

## 2012-06-20 ENCOUNTER — Encounter (HOSPITAL_COMMUNITY): Payer: BC Managed Care – PPO

## 2012-06-27 ENCOUNTER — Ambulatory Visit (HOSPITAL_BASED_OUTPATIENT_CLINIC_OR_DEPARTMENT_OTHER): Payer: BC Managed Care – PPO | Admitting: *Deleted

## 2012-06-27 ENCOUNTER — Encounter (HOSPITAL_BASED_OUTPATIENT_CLINIC_OR_DEPARTMENT_OTHER): Payer: Self-pay

## 2012-06-27 ENCOUNTER — Encounter (HOSPITAL_BASED_OUTPATIENT_CLINIC_OR_DEPARTMENT_OTHER)
Admission: RE | Disposition: A | Discharge status: Home / Self Care | Payer: Self-pay | Source: Ambulatory Visit | Attending: Surgery

## 2012-06-27 ENCOUNTER — Ambulatory Visit (HOSPITAL_BASED_OUTPATIENT_CLINIC_OR_DEPARTMENT_OTHER)
Admission: RE | Admit: 2012-06-27 | Discharge: 2012-06-27 | Disposition: A | Discharge status: Home / Self Care | Payer: BC Managed Care – PPO | Source: Ambulatory Visit | Attending: Surgery | Admitting: Surgery

## 2012-06-27 ENCOUNTER — Ambulatory Visit
Admission: RE | Admit: 2012-06-27 | Discharge: 2012-06-27 | Disposition: A | Discharge status: Home / Self Care | Payer: BC Managed Care – PPO | Source: Ambulatory Visit | Attending: Surgery | Admitting: Surgery

## 2012-06-27 ENCOUNTER — Encounter (HOSPITAL_COMMUNITY)
Admission: RE | Admit: 2012-06-27 | Discharge: 2012-06-27 | Disposition: A | Discharge status: Home / Self Care | Payer: BC Managed Care – PPO | Source: Ambulatory Visit | Attending: Surgery | Admitting: Surgery

## 2012-06-27 ENCOUNTER — Encounter (HOSPITAL_BASED_OUTPATIENT_CLINIC_OR_DEPARTMENT_OTHER): Payer: Self-pay | Admitting: *Deleted

## 2012-06-27 DIAGNOSIS — Z7982 Long term (current) use of aspirin: Secondary | ICD-10-CM | POA: Insufficient documentation

## 2012-06-27 DIAGNOSIS — C50911 Malignant neoplasm of unspecified site of right female breast: Secondary | ICD-10-CM

## 2012-06-27 DIAGNOSIS — Z800 Family history of malignant neoplasm of digestive organs: Secondary | ICD-10-CM | POA: Insufficient documentation

## 2012-06-27 DIAGNOSIS — Z170 Estrogen receptor positive status [ER+]: Secondary | ICD-10-CM | POA: Insufficient documentation

## 2012-06-27 DIAGNOSIS — N6019 Diffuse cystic mastopathy of unspecified breast: Secondary | ICD-10-CM | POA: Insufficient documentation

## 2012-06-27 DIAGNOSIS — D249 Benign neoplasm of unspecified breast: Secondary | ICD-10-CM | POA: Insufficient documentation

## 2012-06-27 DIAGNOSIS — C50919 Malignant neoplasm of unspecified site of unspecified female breast: Secondary | ICD-10-CM

## 2012-06-27 DIAGNOSIS — D239 Other benign neoplasm of skin, unspecified: Secondary | ICD-10-CM | POA: Insufficient documentation

## 2012-06-27 DIAGNOSIS — Z87891 Personal history of nicotine dependence: Secondary | ICD-10-CM | POA: Insufficient documentation

## 2012-06-27 DIAGNOSIS — Z882 Allergy status to sulfonamides status: Secondary | ICD-10-CM | POA: Insufficient documentation

## 2012-06-27 DIAGNOSIS — Z880 Allergy status to penicillin: Secondary | ICD-10-CM | POA: Insufficient documentation

## 2012-06-27 DIAGNOSIS — C50419 Malignant neoplasm of upper-outer quadrant of unspecified female breast: Secondary | ICD-10-CM | POA: Insufficient documentation

## 2012-06-27 DIAGNOSIS — D235 Other benign neoplasm of skin of trunk: Secondary | ICD-10-CM

## 2012-06-27 DIAGNOSIS — Z79899 Other long term (current) drug therapy: Secondary | ICD-10-CM | POA: Insufficient documentation

## 2012-06-27 DIAGNOSIS — Z803 Family history of malignant neoplasm of breast: Secondary | ICD-10-CM | POA: Insufficient documentation

## 2012-06-27 HISTORY — DX: Unspecified osteoarthritis, unspecified site: M19.90

## 2012-06-27 HISTORY — DX: Presence of spectacles and contact lenses: Z97.3

## 2012-06-27 HISTORY — PX: BREAST LUMPECTOMY WITH NEEDLE LOCALIZATION AND AXILLARY SENTINEL LYMPH NODE BX: SHX5760

## 2012-06-27 HISTORY — DX: Other specified health status: Z78.9

## 2012-06-27 SURGERY — BREAST LUMPECTOMY WITH NEEDLE LOCALIZATION AND AXILLARY SENTINEL LYMPH NODE BX
Anesthesia: General | Site: Breast | Laterality: Right | Wound class: Clean

## 2012-06-27 MED ORDER — FENTANYL CITRATE 0.05 MG/ML IJ SOLN
INTRAMUSCULAR | Status: DC | PRN
  Administered 2012-06-27: 25 ug via INTRAVENOUS
  Administered 2012-06-27: 50 ug via INTRAVENOUS
  Administered 2012-06-27: 25 ug via INTRAVENOUS

## 2012-06-27 MED ORDER — MIDAZOLAM HCL 2 MG/2ML IJ SOLN
1.0000 mg | INTRAMUSCULAR | Status: DC | PRN
  Administered 2012-06-27: 2 mg via INTRAVENOUS

## 2012-06-27 MED ORDER — FENTANYL CITRATE 0.05 MG/ML IJ SOLN
50.0000 ug | INTRAMUSCULAR | Status: DC | PRN
  Administered 2012-06-27: 100 ug via INTRAVENOUS

## 2012-06-27 MED ORDER — CHLORHEXIDINE GLUCONATE 4 % EX LIQD: 1.0000 "application " | Freq: Once | CUTANEOUS | Status: DC

## 2012-06-27 MED ORDER — VANCOMYCIN HCL IN DEXTROSE 1-5 GM/200ML-% IV SOLN
1000.0000 mg | INTRAVENOUS | Status: AC
  Administered 2012-06-27: 1000 mg via INTRAVENOUS

## 2012-06-27 MED ORDER — MIDAZOLAM HCL 2 MG/2ML IJ SOLN: 1.0000 mg | INTRAMUSCULAR | Status: DC | PRN

## 2012-06-27 MED ORDER — TECHNETIUM TC 99M SULFUR COLLOID FILTERED
1.0000 | Freq: Once | INTRAVENOUS | Status: AC | PRN
  Administered 2012-06-27: 1 via INTRADERMAL

## 2012-06-27 MED ORDER — BUPIVACAINE-EPINEPHRINE 0.25% -1:200000 IJ SOLN
INTRAMUSCULAR | Status: DC | PRN
  Administered 2012-06-27: 20 mL

## 2012-06-27 MED ORDER — FENTANYL CITRATE 0.05 MG/ML IJ SOLN: 50.0000 ug | Freq: Once | INTRAMUSCULAR | Status: DC

## 2012-06-27 MED ORDER — HYDROMORPHONE HCL PF 1 MG/ML IJ SOLN
0.2500 mg | INTRAMUSCULAR | Status: DC | PRN
  Administered 2012-06-27 (×2): 0.5 mg via INTRAVENOUS

## 2012-06-27 MED ORDER — LACTATED RINGERS IV SOLN
INTRAVENOUS | Status: DC
  Administered 2012-06-27: 13:00:00 via INTRAVENOUS
  Administered 2012-06-27: 20 mL/h via INTRAVENOUS

## 2012-06-27 MED ORDER — DEXAMETHASONE SODIUM PHOSPHATE 4 MG/ML IJ SOLN
INTRAMUSCULAR | Status: DC | PRN
  Administered 2012-06-27: 10 mg via INTRAVENOUS

## 2012-06-27 MED ORDER — LIDOCAINE HCL (CARDIAC) 20 MG/ML IV SOLN
INTRAVENOUS | Status: DC | PRN
  Administered 2012-06-27: 40 mg via INTRAVENOUS

## 2012-06-27 MED ORDER — CIPROFLOXACIN IN D5W 400 MG/200ML IV SOLN
INTRAVENOUS | Status: DC | PRN
  Administered 2012-06-27: 400 mg via INTRAVENOUS

## 2012-06-27 MED ORDER — ONDANSETRON HCL 4 MG/2ML IJ SOLN
INTRAMUSCULAR | Status: DC | PRN
  Administered 2012-06-27: 4 mg via INTRAVENOUS

## 2012-06-27 MED ORDER — PROMETHAZINE HCL 25 MG/ML IJ SOLN
6.2500 mg | INTRAMUSCULAR | Status: DC | PRN
  Administered 2012-06-27: 6.25 mg via INTRAVENOUS

## 2012-06-27 MED ORDER — OXYCODONE-ACETAMINOPHEN 5-325 MG PO TABS: 1.0000 | ORAL_TABLET | ORAL | Status: DC | PRN

## 2012-06-27 MED ORDER — PROPOFOL 10 MG/ML IV BOLUS
INTRAVENOUS | Status: DC | PRN
  Administered 2012-06-27: 130 mg via INTRAVENOUS

## 2012-06-27 SURGICAL SUPPLY — 56 items
APPLIER CLIP 9.375 MED OPEN (MISCELLANEOUS)
BINDER BREAST LRG (GAUZE/BANDAGES/DRESSINGS) IMPLANT
BINDER BREAST MEDIUM (GAUZE/BANDAGES/DRESSINGS) IMPLANT
BINDER BREAST XLRG (GAUZE/BANDAGES/DRESSINGS) ×2 IMPLANT
BINDER BREAST XXLRG (GAUZE/BANDAGES/DRESSINGS) IMPLANT
BLADE SURG 15 STRL LF DISP TIS (BLADE) ×1 IMPLANT
BLADE SURG 15 STRL SS (BLADE) ×1
BLADE SURG ROTATE 9660 (MISCELLANEOUS) IMPLANT
CANISTER SUCTION 1200CC (MISCELLANEOUS) ×2 IMPLANT
CHLORAPREP W/TINT 26ML (MISCELLANEOUS) ×2 IMPLANT
CLIP APPLIE 9.375 MED OPEN (MISCELLANEOUS) IMPLANT
CLOTH BEACON ORANGE TIMEOUT ST (SAFETY) ×2 IMPLANT
COVER MAYO STAND STRL (DRAPES) ×2 IMPLANT
COVER PROBE W GEL 5X96 (DRAPES) ×2 IMPLANT
COVER TABLE BACK 60X90 (DRAPES) ×2 IMPLANT
DECANTER SPIKE VIAL GLASS SM (MISCELLANEOUS) IMPLANT
DERMABOND ADVANCED (GAUZE/BANDAGES/DRESSINGS) ×2
DERMABOND ADVANCED .7 DNX12 (GAUZE/BANDAGES/DRESSINGS) ×2 IMPLANT
DEVICE DUBIN W/COMP PLATE 8390 (MISCELLANEOUS) IMPLANT
DRAIN CHANNEL 19F RND (DRAIN) IMPLANT
DRAIN HEMOVAC 1/8 X 5 (WOUND CARE) IMPLANT
DRAPE LAPAROSCOPIC ABDOMINAL (DRAPES) ×2 IMPLANT
DRAPE UTILITY XL STRL (DRAPES) ×2 IMPLANT
ELECT COATED BLADE 2.86 ST (ELECTRODE) ×2 IMPLANT
ELECT REM PT RETURN 9FT ADLT (ELECTROSURGICAL) ×2
ELECTRODE REM PT RTRN 9FT ADLT (ELECTROSURGICAL) ×1 IMPLANT
EVACUATOR SILICONE 100CC (DRAIN) IMPLANT
GAUZE SPONGE 4X4 12PLY STRL LF (GAUZE/BANDAGES/DRESSINGS) IMPLANT
GLOVE BIOGEL PI IND STRL 8 (GLOVE) ×1 IMPLANT
GLOVE BIOGEL PI INDICATOR 8 (GLOVE) ×1
GLOVE ECLIPSE 8.0 STRL XLNG CF (GLOVE) ×2 IMPLANT
GLOVE SKINSENSE NS SZ7.5 (GLOVE) ×2
GLOVE SKINSENSE STRL SZ7.5 (GLOVE) ×2 IMPLANT
GOWN PREVENTION PLUS XLARGE (GOWN DISPOSABLE) ×2 IMPLANT
HEMOSTAT SURGICEL 2X14 (HEMOSTASIS) IMPLANT
KIT MARKER MARGIN INK (KITS) ×2 IMPLANT
NDL SAFETY ECLIPSE 18X1.5 (NEEDLE) ×1 IMPLANT
NEEDLE HYPO 18GX1.5 SHARP (NEEDLE) ×1
NEEDLE HYPO 25X1 1.5 SAFETY (NEEDLE) ×4 IMPLANT
NS IRRIG 1000ML POUR BTL (IV SOLUTION) ×2 IMPLANT
PACK BASIN DAY SURGERY FS (CUSTOM PROCEDURE TRAY) ×2 IMPLANT
PENCIL BUTTON HOLSTER BLD 10FT (ELECTRODE) ×2 IMPLANT
PIN SAFETY STERILE (MISCELLANEOUS) IMPLANT
SLEEVE SCD COMPRESS KNEE MED (MISCELLANEOUS) ×2 IMPLANT
SPONGE LAP 18X18 X RAY DECT (DISPOSABLE) IMPLANT
SPONGE LAP 4X18 X RAY DECT (DISPOSABLE) ×2 IMPLANT
SUT ETHILON 3 0 PS 1 (SUTURE) IMPLANT
SUT MNCRL AB 3-0 PS2 18 (SUTURE) ×4 IMPLANT
SUT SILK 2 0 SH (SUTURE) IMPLANT
SUT VICRYL 3-0 CR8 SH (SUTURE) ×2 IMPLANT
SYR BULB 3OZ (MISCELLANEOUS) ×2 IMPLANT
SYR CONTROL 10ML LL (SYRINGE) ×4 IMPLANT
TOWEL OR 17X24 6PK STRL BLUE (TOWEL DISPOSABLE) ×2 IMPLANT
TOWEL OR NON WOVEN STRL DISP B (DISPOSABLE) ×2 IMPLANT
TUBE CONNECTING 20X1/4 (TUBING) ×2 IMPLANT
YANKAUER SUCT BULB TIP NO VENT (SUCTIONS) ×2 IMPLANT

## 2012-06-27 NOTE — Anesthesia Procedure Notes (Signed)
Procedure Name: LMA Insertion Date/Time: 06/27/2012 2:10 PM Performed by: Meyer Russel Pre-anesthesia Checklist: Patient identified, Emergency Drugs available, Suction available and Patient being monitored Patient Re-evaluated:Patient Re-evaluated prior to inductionOxygen Delivery Method: Circle System Utilized Preoxygenation: Pre-oxygenation with 100% oxygen Intubation Type: IV induction Ventilation: Mask ventilation without difficulty LMA: LMA inserted LMA Size: 4.0 Number of attempts: 1 Airway Equipment and Method: bite block Placement Confirmation: positive ETCO2 and breath sounds checked- equal and bilateral Tube secured with: Tape Dental Injury: Teeth and Oropharynx as per pre-operative assessment

## 2012-06-27 NOTE — H&P (View-Only) (Signed)
Patient ID: Kathleen Harris, female   DOB: 11/17/1945, 66 y.o.   MRN: [id number]  Chief Complaint  Patient presents with  . New Evaluation    eval Rt br ca    HPI Kathleen Harris is a 66 y.o. female.  Patient sent at the request of Dr. Jackson of the breast Center  do to right breast cancer. 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 Patient denies any symptoms of breast mass, breast pain or nipple discharge. Had previous breast biopsy in the left which was benign in 2011. HPI  History reviewed. No pertinent past medical history.  Past Surgical History  Procedure Laterality Date  . Tonsillectomy and adenoidectomy    . Appendectomy    . Breast surgery      Several lumectomies.  . Abdominal hysterectomy      Family History  Problem Relation Age of Onset  . Cancer Mother     Breast  . Cancer Father     Colon  . Cancer Sister     Breast  . Heart attack Brother     Social History History  Substance Use Topics  . Smoking status: Former Smoker    Types: Cigarettes    Quit date: 01/31/1971  . Smokeless tobacco: Never Used  . Alcohol Use: No    Allergies  Allergen Reactions  . Penicillins Rash  . Sulfonamide Derivatives Nausea Only    Current Outpatient Prescriptions  Medication Sig Dispense Refill  . Ascorbic Acid (VITAMIN C PO) Take by mouth.      . aspirin 81 MG tablet Take 81 mg by mouth daily.      . b complex vitamins tablet Take 1 tablet by mouth daily.      . Multiple Vitamins-Minerals (MULTIVITAMIN WITH MINERALS) tablet Take 1 tablet by mouth daily.       No current facility-administered medications for this visit.    Review of Systems Review of Systems  Constitutional: Negative for fever, chills and unexpected weight change.  HENT: Negative for hearing loss, congestion, sore  throat, trouble swallowing and voice change.   Eyes: Negative for visual disturbance.  Respiratory: Negative for cough and wheezing.   Cardiovascular: Negative for chest pain, palpitations and leg swelling.  Gastrointestinal: Negative for nausea, vomiting, abdominal pain, diarrhea, constipation, blood in stool, abdominal distention and anal bleeding.  Genitourinary: Negative for hematuria, vaginal bleeding and difficulty urinating.  Musculoskeletal: Negative for arthralgias.  Skin: Negative for rash and wound.  Neurological: Negative for seizures, syncope and headaches.  Hematological: Negative for adenopathy. Does not bruise/bleed easily.  Psychiatric/Behavioral: Negative for confusion.    Blood pressure 110/70, pulse 88, temperature 97.8 F (36.6 C), temperature source Temporal, resp. rate 18, height 5' 3" (1.6 m), weight 160 lb 3.2 oz (72.666 kg).  Physical Exam Physical Exam  Constitutional: She is oriented to person, place, and time. She appears well-developed and well-nourished.  HENT:  Head: Normocephalic and atraumatic.  Eyes: EOM are normal. Pupils are equal, round, and reactive to light.  Neck: Normal range of motion. Neck supple.  Cardiovascular: Normal rate and regular rhythm.   Pulmonary/Chest: Effort normal and breath sounds normal. Right breast exhibits no inverted nipple, no mass, no nipple discharge, no skin change and no tenderness. Left breast exhibits no inverted nipple, no mass, no nipple discharge, no skin change and no tenderness. Breasts are   symmetrical.    Musculoskeletal: Normal range of motion.  Lymphadenopathy:    She has no cervical adenopathy.  Neurological: She is alert and oriented to person, place, and time.  Skin: Skin is warm and dry.  Psychiatric: She has a normal mood and affect. Her behavior is normal. Judgment and thought content normal.    Data Reviewed Clinical Data: Recently diagnosed right breast invasive ductal  carcinoma. Preoperative  evaluation.  BUN and creatinine were obtained on site at [facility] at  [address]  Results: BUN 8 mg/dL, Creatinine 0.9 mg/dL.  BILATERAL BREAST MRI WITH AND WITHOUT CONTRAST  Technique: Multiplanar, multisequence MR images of both breasts  were obtained prior to and following the intravenous administration  of 15ml of Multihance. Three dimensional images were evaluated at  the independent DynaCad workstation.  Comparison: Mammograms dated 05/21/2012, 06/04/2012, 05/19/2011  and ultrasound dated 06/04/2012.  Findings: There is an irregular enhancing mass with central clip  artifact located within the upper-outer quadrant of the right  breast at the 10 o'clock position (middle one third). This  measures 1.4 x 0.9 x 0.8 cm in size. This is associated with  plateau type enhancement kinetics. There are no additional  worrisome enhancing foci within either breast. There is no  evidence for axillary or internal mammary adenopathy. There is a 5  mm circumscribed lesion within the anterior right lobe of liver  which is bright on the inversion recovery and T2 sequences most  consistent with a small incidental cyst or hemangioma. There are  no additional findings.  IMPRESSION:  1. 1.4 cm irregular enhancing mass located within the upper-outer  quadrant of the right breast (middle third) corresponding to the  recently diagnosed invasive ductal carcinoma.  2. 5 mm probable incidental cyst versus small hemangioma located  within the anterior right lobe of the liver that as discussed  above.   Assessment    Stage 1 right breast cancer ER/PR positive Her 2 neu negative    Plan    Patient to proceed with right breast conservation. Discuss right breast lumpectomy with sentinel lymph node mapping. Risks, benefits and long term expectations discussed. Adjuvant therapy discussed. Mastectomy discussed as well. She would like to proceed with right breast lumpectomy with sentinel  lymph node mapping and wishes to have a small mole removed from her right breast at the same time.The procedure has been discussed with the patient. Alternatives to surgery have been discussed with the patient.  Risks of surgery include bleeding,  Infection,  Seroma formation, death,  and the need for further surgery.   The patient understands and wishes to proceed.Sentinel lymph node mapping and dissection has been discussed with the patient.  Risk of bleeding,  Infection,  Seroma formation,  Additional procedures,,  Shoulder weakness ,  Shoulder stiffness,  Nerve and blood vessel injury and reaction to the mapping dyes have been discussed.  Alternatives to surgery have been discussed with the patient.  The patient agrees to proceed.       Dazhane Villagomez A. 06/17/2012, 10:41 AM    

## 2012-06-27 NOTE — Interval H&P Note (Signed)
History and Physical Interval Note:  06/27/2012 1:15 PM  Kathleen Harris  has presented today for surgery, with the diagnosis of right breast cancer   The various methods of treatment have been discussed with the patient and family. After consideration of risks, benefits and other options for treatment, the patient has consented to  Procedure(s) with comments: BREAST LUMPECTOMY WITH NEEDLE LOCALIZATION AND AXILLARY SENTINEL LYMPH NODE BX remove skin lesion right breast  (Right) - 11:00 needle localization at BCG  1:00 nuclear medicine injection  as a surgical intervention .  The patient's history has been reviewed, patient examined, no change in status, stable for surgery.  I have reviewed the patient's chart and labs.  Questions were answered to the patient's satisfaction.     Dalyn Becker A.

## 2012-06-27 NOTE — Anesthesia Postprocedure Evaluation (Signed)
  Anesthesia Post-op Note  Patient: Kathleen Harris  Procedure(s) Performed: Procedure(s) with comments: BREAST LUMPECTOMY WITH NEEDLE LOCALIZATION AND AXILLARY SENTINEL LYMPH NODE BX remove skin lesion right breast  (Right) - 11:00 needle localization at BCG  1:00 nuclear medicine injection   Patient Location: PACU  Anesthesia Type:General  Level of Consciousness: awake and alert   Airway and Oxygen Therapy: Patient Spontanous Breathing and Patient connected to face mask oxygen  Post-op Pain: mild  Post-op Assessment: Post-op Vital signs reviewed, Patient's Cardiovascular Status Stable, Respiratory Function Stable, Patent Airway and No signs of Nausea or vomiting  Post-op Vital Signs: Reviewed and stable  Complications: No apparent anesthesia complications

## 2012-06-27 NOTE — Anesthesia Preprocedure Evaluation (Addendum)
Anesthesia Evaluation  Patient identified by MRN, date of birth, ID band Patient awake    Reviewed: Allergy & Precautions, H&P , NPO status , reviewed documented beta blocker date and time   Airway Mallampati: II      Dental no notable dental hx.    Pulmonary neg pulmonary ROS,          Cardiovascular negative cardio ROS  Rhythm:Regular Rate:Normal     Neuro/Psych negative neurological ROS     GI/Hepatic negative GI ROS, Neg liver ROS,   Endo/Other    Renal/GU      Musculoskeletal   Abdominal   Peds  Hematology   Anesthesia Other Findings   Reproductive/Obstetrics                          Anesthesia Physical Anesthesia Plan  ASA: III  Anesthesia Plan: General   Post-op Pain Management:    Induction: Intravenous  Airway Management Planned: LMA  Additional Equipment:   Intra-op Plan:   Post-operative Plan:   Informed Consent:   Dental advisory given  Plan Discussed with: CRNA, Anesthesiologist and Surgeon  Anesthesia Plan Comments:         Anesthesia Quick Evaluation

## 2012-06-27 NOTE — Progress Notes (Signed)
Post nuc med inj. VSS SR up HOB up 30 degrees  Monitor on [place].

## 2012-06-27 NOTE — Progress Notes (Signed)
Patient complained of nausea .  Dr. Sampson Goon notified and orders received.

## 2012-06-27 NOTE — Transfer of Care (Signed)
Immediate Anesthesia Transfer of Care Note  Patient: Kathleen Harris  Procedure(s) Performed: Procedure(s) with comments: BREAST LUMPECTOMY WITH NEEDLE LOCALIZATION AND AXILLARY SENTINEL LYMPH NODE BX remove skin lesion right breast  (Right) - 11:00 needle localization at BCG  1:00 nuclear medicine injection   Patient Location: PACU  Anesthesia Type:General  Level of Consciousness: awake, alert  and oriented  Airway & Oxygen Therapy: Patient Spontanous Breathing and Patient connected to face mask oxygen  Post-op Assessment: Report given to PACU RN and Post -op Vital signs reviewed and stable  Post vital signs: Reviewed and stable  Complications: No apparent anesthesia complications

## 2012-06-27 NOTE — Op Note (Signed)
Right Breast Lumpectomy with Sentinal Node Biopsy Procedure Note with excision of skin lesion 1 cm   Indications: This patient presents with history of right breast cancer with clinically negative axillary lymph node exam.The procedure has been discussed with the patient. Alternatives to surgery have been discussed with the patient.  Risks of surgery include bleeding,  Infection,  Seroma formation, death,  and the need for further surgery.   The patient understands and wishes to proceed.Sentinel lymph node mapping and dissection has been discussed with the patient.  Risk of bleeding,  Infection,  Seroma formation,  Additional procedures,,  Shoulder weakness ,  Shoulder stiffness,  Nerve and blood vessel injury and reaction to the mapping dyes have been discussed.  Alternatives to surgery have been discussed with the patient.  The patient agrees to proceed.  Pre-operative Diagnosis: right breast cancer      1 cm skin lesion breast   Post-operative Diagnosis: right breast cancer plus skin lesion breast  Surgeon: Harriette Bouillon A.   Assistants: OR  Anesthesia: General endotracheal anesthesia and Local anesthesia 0.25.% bupivacaine, with epinephrine  ASA Class: 2  Procedure Details  The patient was seen in the Holding Room. The risks, benefits, complications, treatment options, and expected outcomes were discussed with the patient. The possibilities of reaction to medication, pulmonary aspiration, bleeding, infection, the need for additional procedures, failure to diagnose a condition, and creating a complication requiring transfusion or operation were discussed with the patient. The patient concurred with the proposed plan, giving informed consent.  The site of surgery properly noted/marked. The patient was taken to Operating Room # 3, identified as Kathleen Harris and the procedure verified as  RIGHT Breast Lumpectomy and Sentinal Node Biopsy. A Time Out was held and the above information  confirmed.  After induction of anesthesia, the right arm, breast, and chest were prepped and draped in standard fashion. The 1 cm skin lesion was excised without difficulty.   Using a hand-held gamma probe, axillary sentinel nodes were identified transcutaneously.  An oblique incision was created below the axillary hairline.  Dissection was carried through the clavipectoral fascia.  2 level 1 axillary sentinel nodes were removed and submitted to pathology.   The axillary incision was closed with a 3-0 Vicryl AND 4 O MONOCRYL  subcuticular closure in layers.    The lumpectomy was performed by creating an oblique incision over the upper outer quadrant of the breastaround the previously placed localization guidewire.  Dissection was carried down to the pectoral fascia.  Orientation dye was  placed and the margins  were inked.  Specimen radiography confirmed inclusion of the mammographic lesion.  Hemostasis was achieved with cautery.  Additional tissue was taken along the anterior margin and submitted separately to pathology after providing orientation for the pathology.  The wound was irrigated and closed with a 3-0 Vicryl and 4 0  subcuticular closure in layers.    Sterile dressings of dermabond  Was  applied. At the end of the operation, all sponge, instrument, and needle counts were correct.  Findings: grossly clear surgical margins  Estimated Blood Loss:  Minimal         Drains: none         Total IV Fluids: 1000 ml         Specimens: 2 SLN breast mass wire and clip                Complications:  None; patient tolerated the procedure well.  Disposition: PACU - hemodynamically stable.         Condition: stable  Attending Attestation: I performed the procedure.

## 2012-06-28 ENCOUNTER — Encounter (HOSPITAL_BASED_OUTPATIENT_CLINIC_OR_DEPARTMENT_OTHER): Payer: Self-pay | Admitting: Surgery

## 2012-07-02 ENCOUNTER — Telehealth (INDEPENDENT_AMBULATORY_CARE_PROVIDER_SITE_OTHER): Payer: Self-pay | Admitting: *Deleted

## 2012-07-02 NOTE — Telephone Encounter (Addendum)
Patient already has a follow up with Dr Luisa Hart on 07/12/12. She will be referred to medical and radiation oncology when she is here for her follow up that day. Path is not ready yet. Once we receive it we will call her.

## 2012-07-02 NOTE — Telephone Encounter (Signed)
Patient called again to check on path results which still are not back at this time.  Informed patient that this RN spoke to Dr. Luisa Hart who explained patient will come back into see him and then he will refer patient onto radiology oncologist and medical oncologist at that time.  Patient states understanding at this time and agreeable.

## 2012-07-02 NOTE — Telephone Encounter (Signed)
Patient called to ask about her path results however they have not resulted at this time.  Patient also asked about scheduling the appt for her to sit down with Dr. Luisa Hart, radiologist and oncologist.

## 2012-07-03 ENCOUNTER — Telehealth (INDEPENDENT_AMBULATORY_CARE_PROVIDER_SITE_OTHER): Payer: Self-pay

## 2012-07-03 DIAGNOSIS — C50911 Malignant neoplasm of unspecified site of right female breast: Secondary | ICD-10-CM

## 2012-07-03 NOTE — Telephone Encounter (Signed)
Message copied by Brennan Bailey on Wed Jul 03, 2012 11:46 AM ------      Message from: Harriette Bouillon A      Created: Tue Jul 02, 2012  4:21 PM       LOOKS GREAT VERY SMALL CANCER NODES CLEAR.  NEEDS TO SEE MED AND RAD ONC ------

## 2012-07-03 NOTE — Telephone Encounter (Signed)
I called patient and told her path results showed small cancer and that her nodes were negative. I let her know her I pt referral into cancer center and they will call her with med and rad onc appointemnts.

## 2012-07-09 ENCOUNTER — Telehealth: Payer: Self-pay | Admitting: *Deleted

## 2012-07-09 ENCOUNTER — Encounter: Payer: Self-pay | Admitting: Oncology

## 2012-07-09 NOTE — Telephone Encounter (Signed)
Confirmed 07/18/12 appt w/ pt.  Mailed before appt letter & packet to pt.  Emailed Music therapist at [facility] to make her aware.  Emailed Clydie Braun for [employer] and to let her know that the pt is in question about how much the radiation treatments are going to run.  Took paperwork to Med Rec for chart.

## 2012-07-10 NOTE — Progress Notes (Incomplete)
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 Lymph node, sentinel, biopsy, Right axilla- ONE LYMPH NODE, NEGATIVE FOR METASTATIC CARCINOMA (0/1).2. Lymph node, sentinel, biopsy, Right axilla - ONE LYMPH NODE, NEGATIVE FOR METASTATIC CARCINOMA (0/1).3. Skin , Right breast- MELANOCYTIC NEVUS, COMPOUND TYPE , EXTENDING TO THE LATERAL EDGES. 4. Breast, lumpectomy, Right- INVASIVE DUCTAL CARCINOMA, NOTTINGHAM COMBINED HISTOLOGIC GRADE I, 0.9 CM, 0.1CM FROM THE INKED ANTERIOR MARGI- COMPLEX SCLEROSING LESION INCLUDING USUAL DUCTAL HYPERPLASIA, INTRADUCTALPAPILLOMA, FIBROSIS AND CYSTS ASSOCIATED WITH MICROCALCIFICATIONS.5. Breast, excision, Right extra anterior margin1 of 4, appt with Dr.Cornett 07/12/12   Past/Anticipated interventions by medical oncology, if any:New consultation scheduled with Dr.Kahn on 07/18/12.  Lymphedema issues, if any:  {yes/no:18581} {Right/Left:21944} right axilla swelling and hard knot, where incision on breast hard know,  Pain issues, if any:  {yes/no:18581} yes under axilla  Scab and swelling , has metal  Clip In  Right breast SAFETY ISSUES:  Prior radiation? no  Pacemaker/ICD? no  Possible current pregnancy?no  Is the patient on methotrexate? no  Current Complaints / other details  Divorced, 2 children,  Mother&(sister (alive age 76, dx 81 ) breast cancer, mother started with breast cancer died of lung cancerfather colon cancer,father deceased colon cancer,   brother MI, former smoker quit 1973 Allergies: PCNS,=rash;,Sulfonamide derivatives=intolerance nausea  Was on premarin 7 years after hysterectomy age 26, then stopped, having hot flashes still and night sweats

## 2012-07-11 ENCOUNTER — Encounter: Payer: Self-pay | Admitting: Radiation Oncology

## 2012-07-11 ENCOUNTER — Ambulatory Visit
Admission: RE | Admit: 2012-07-11 | Discharge: 2012-07-11 | Disposition: A | Discharge status: Home / Self Care | Payer: BC Managed Care – PPO | Source: Ambulatory Visit | Attending: Radiation Oncology | Admitting: Radiation Oncology

## 2012-07-11 VITALS — BP 120/58 | HR 91 | Temp 96.9°F | Resp 20 | Ht 63.0 in | Wt 158.7 lb

## 2012-07-11 DIAGNOSIS — C50411 Malignant neoplasm of upper-outer quadrant of right female breast: Secondary | ICD-10-CM

## 2012-07-11 DIAGNOSIS — C50919 Malignant neoplasm of unspecified site of unspecified female breast: Secondary | ICD-10-CM | POA: Insufficient documentation

## 2012-07-11 HISTORY — DX: Unspecified asthma, uncomplicated: J45.909

## 2012-07-11 HISTORY — DX: Allergy, unspecified, initial encounter: T78.40XA

## 2012-07-11 HISTORY — DX: Cardiac arrhythmia, unspecified: I49.9

## 2012-07-11 HISTORY — DX: Flushing: R23.2

## 2012-07-11 NOTE — Progress Notes (Signed)
[facility] Radiation Oncology NEW PATIENT EVALUATION  Name: Kathleen Harris MRN: [id number]  Date:   07/11/2012           DOB: [birth year]  Status: outpatient   CC: [name], MD  Cornett, Clovis Pu., MD , Dr. 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 female who is seen today for the courtesy of Dr. Luisa Hart for consideration of radiation therapy following conservative surgery in the management of her T1b N0 invasive ductal carcinoma of the right breast. At the [facility] of [facility] she was found to have possible architectural distortion within the right breast. Additional views on 06/04/2012 showed persistent architectural distortion with a 0.6 cm hypoechoic lesion at 10:00 within the right breast on ultrasound. An ultrasound-guided core biopsy on 06/04/2012 was diagnostic for invasive ductal carcinoma and also DCIS with calcifications. The tumor was  ER positive at 100% and PR +100% with a proliferation marker/Ki-67 of 15%. HER-2/neu was negative. Breast MR on 06/10/2012 showed a 1.4 cm mass within the upper-outer quadrant of the right breast (middle third) without other areas suspicious for malignancy. On 06/27/2012 she underwent a right partial mastectomy and sentinel lymph node biopsy. She was found have a 0.9 cm grade 1 invasive ductal carcinoma which initially extending  to within 0.1 cm from the anterior inked margin with an additional anterior margin being negative for malignancy. There was no DCIS seen. 2 sentinel lymph nodes were free of metastatic disease. She is to see Dr. Welton Flakes in consultation on June 19. She has a family history of breast cancer and also a 7 year history of hormone replacement therapy.  PREVIOUS RADIATION THERAPY: No   PAST MEDICAL HISTORY:  has a past  medical history of Wears glasses; Medical history non-contributory; Allergy; Arthritis; Asthma; Irregular heart beat; and Hot flashes.     PAST SURGICAL HISTORY:  Past Surgical History  Procedure Laterality Date  . Tonsillectomy and adenoidectomy    . Appendectomy    . Breast surgery  2010    Several lumectomies.lt  . Colonoscopy    . Breast lumpectomy with needle localization and axillary sentinel lymph node bx Right 06/27/2012    Procedure: BREAST LUMPECTOMY WITH NEEDLE LOCALIZATION AND AXILLARY SENTINEL LYMPH NODE BX remove skin lesion right breast ;  Surgeon: Clovis Pu. Cornett, MD;  Location: [facility];  Service: General;  Laterality: Right;  11:00 needle localization at BCG  1:00 nuclear medicine injection   . Abdominal hysterectomy  1998    b/l oopherectomt     FAMILY HISTORY: family history includes Cancer in her father, mother, and sister and Heart attack in her brother. her father was diagnosed with colon cancer died at age 31. Her mother was diagnosed with breast cancer in her mid 16s but died of lung cancer at 31. A sister was diagnosed with breast cancer 84.   SOCIAL HISTORY:  reports that she quit smoking about 41 years ago. Her smoking use included Cigarettes. She smoked 0.00 packs per day. She has never used smokeless tobacco. She reports that she does not drink alcohol or use illicit drugs. 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  ALLERGIES: Latex; Penicillins; and Sulfonamide  derivatives   MEDICATIONS:  Current Outpatient Prescriptions  Medication Sig Dispense Refill  . calcium & magnesium carbonates (MYLANTA) 311-232 MG per tablet Take 1 tablet by mouth daily.      . cholecalciferol (VITAMIN D) 1000 UNITS tablet Take 1,000 Units by mouth daily. d3      . ibuprofen (ADVIL,MOTRIN) 200 MG tablet Take 200 mg by mouth every 6 (six) hours as needed for pain.      Marland Kitchen OVER THE COUNTER MEDICATION  Take 2 capsules by mouth 2 (two) times daily. 2 capsules in am, 3rd capsule 2pm daily      . PHYTOSTEROLS PO Take 2 capsules by mouth 2 (two) times daily. 1 in am 1 in afternoon      . vitamin C (ASCORBIC ACID) 500 MG tablet Take 500 mg by mouth daily.      . Ascorbic Acid (VITAMIN C PO) Take by mouth.      Marland Kitchen aspirin 81 MG tablet Take 81 mg by mouth daily.      Marland Kitchen b complex vitamins tablet Take 1 tablet by mouth daily.      . Multiple Vitamins-Minerals (MULTIVITAMIN WITH MINERALS) tablet Take 1 tablet by mouth daily.      Marland Kitchen oxyCODONE-acetaminophen (ROXICET) 5-325 MG per tablet Take 1 tablet by mouth every 4 (four) hours as needed for pain.  30 tablet  0   No current facility-administered medications for this encounter.     REVIEW OF SYSTEMS:  Pertinent items are noted in HPI.    PHYSICAL EXAM:  height is 5\' 3"  (1.6 m) and weight is 158 lb 11.2 oz (71.986 kg). Her oral temperature is 96.9 F (36.1 C). Her blood pressure is 120/58 and her pulse is 91. Her respiration is 20.   Alert and oriented 67 year old white female appearing her stated age. Head and neck examination: Grossly unremarkable. Nodes: Without palpable cervical, supraclavicular, or axillary lymphadenopathy. Chest: Lungs clear. Back: Without spinal or CVA tenderness. Breasts: There is a partial mastectomy wound at approximately 10 to 11:00 along the upper-outer quadrant of the right breast. There is surrounding ecchymosis. Left breast without masses or lesions. There is a scar along the upper outer quadrant of left breast from a partial mastectomy in 2010. Abdomen: Without hepatomegaly. Extremities: Without edema.    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 Of note is that she does have a strong family history of breast cancer in her mother and sister, and I do recommend genetic counseling/testing. This can be arranged by Dr. Welton Flakes who will see her next week. Whether not she is BRCA1 or BRCA2 positive she can proceed with breast preservation based on her age. We discussed mastectomy versus partial mastectomy followed by radiation therapy. We talked about waiting until completion of genetic testing which may be up to a month from now before proceeding with radiation therapy if she would consider mastectomy.Marland Kitchen She  is in favor of proceeding with radiation therapy/breast preservation even if she is BRCA1 or BRCA2 positive. I think this is perfectly reasonable. She does have 2 daughters would benefit from her genetic testing. We discussed the potential acute and late toxicities of radiation therapy. Consent is signed today. I feel certain she will be offered adjuvant hormone therapy through Dr. Welton Flakes. I will get her tentatively scheduled for simulation/treatment planning on June 23.   PLAN:  as discussed above.   I spent 60 minutes minutes face to face with the patient and more than 50% of that time was spent in counseling and/or coordination of care.

## 2012-07-11 NOTE — Progress Notes (Signed)
Please see the Nurse Progress Note in the MD Initial Consult Encounter for this patient. 

## 2012-07-12 ENCOUNTER — Encounter (INDEPENDENT_AMBULATORY_CARE_PROVIDER_SITE_OTHER): Payer: Self-pay | Admitting: Surgery

## 2012-07-12 ENCOUNTER — Ambulatory Visit (INDEPENDENT_AMBULATORY_CARE_PROVIDER_SITE_OTHER): Payer: BC Managed Care – PPO | Admitting: Surgery

## 2012-07-12 VITALS — BP 102/60 | HR 76 | Temp 96.6°F | Resp 14 | Ht 63.0 in | Wt 160.0 lb

## 2012-07-12 DIAGNOSIS — Z9889 Other specified postprocedural states: Secondary | ICD-10-CM

## 2012-07-12 NOTE — Progress Notes (Signed)
Patient returns after right breast lumpectomy and sentinel lymph node mapping stage I right breast cancer. She is doing well except swelling.  Exam: Mild ecchymoses noted the right breast. Incisions clean dry and intact. Mild swelling at the axillary site without redness. Minimal seroma at the lumpectomy site.  Impression: Status post right breast lumpectomy for stage I right breast cancer node negative  Plan: Return in 3 weeks.

## 2012-07-12 NOTE — Patient Instructions (Signed)
Return in 3 weeks

## 2012-07-18 ENCOUNTER — Ambulatory Visit (HOSPITAL_BASED_OUTPATIENT_CLINIC_OR_DEPARTMENT_OTHER): Payer: BC Managed Care – PPO | Admitting: Oncology

## 2012-07-18 ENCOUNTER — Ambulatory Visit: Payer: BC Managed Care – PPO

## 2012-07-18 ENCOUNTER — Other Ambulatory Visit: Payer: Self-pay | Admitting: Emergency Medicine

## 2012-07-18 ENCOUNTER — Other Ambulatory Visit: Payer: BC Managed Care – PPO | Admitting: Lab

## 2012-07-18 ENCOUNTER — Telehealth: Payer: Self-pay | Admitting: *Deleted

## 2012-07-18 ENCOUNTER — Encounter: Payer: Self-pay | Admitting: Oncology

## 2012-07-18 VITALS — BP 135/80 | HR 74 | Temp 98.3°F | Resp 20 | Ht 63.0 in | Wt 158.6 lb

## 2012-07-18 DIAGNOSIS — C50412 Malignant neoplasm of upper-outer quadrant of left female breast: Secondary | ICD-10-CM

## 2012-07-18 DIAGNOSIS — C50419 Malignant neoplasm of upper-outer quadrant of unspecified female breast: Secondary | ICD-10-CM

## 2012-07-18 NOTE — Patient Instructions (Addendum)
#1We discussed your radiology, pathology and diagnosis.  #2 Discuss treatment of breast cancer including surgery and radiation.  #3 We discussed use of antiestrogen therapy to help prevent breast cancer recurrence. We discussed using tamoxifen 20 mg daily for a total of 5 years after radiation is completed. We also discussed the other antiestrogen therapy skull the aromatase inhibitors which include Arimidex Aromasin and Femara.  #4 we discussed seeing you back after completion of radiation there be and then getting you started on tamoxifen. Information on tamoxifen is noted below.  #5 we discussed ways of reducing hot flashes including use of Effexor or gabapentin. We also discussed complementary ways of reducing hot flashes such as through yoga exercise and begin acupuncture.  #6 I will see you back in 4-6 weeks' time for followup.  Tamoxifen oral tablet What is this medicine? TAMOXIFEN (ta MOX i fen) blocks the effects of estrogen. It is commonly used to treat breast cancer. It is also used to decrease the chance of breast cancer coming back in women who have received treatment for the disease. It may also help prevent breast cancer in women who have a high risk of developing breast cancer. This medicine may be used for other purposes; ask your health care provider or pharmacist if you have questions. What should I tell my health care provider before I take this medicine? They need to know if you have any of these conditions: -blood clots -blood disease -cataracts or impaired eyesight -endometriosis -high calcium levels -high cholesterol -irregular menstrual cycles -liver disease -stroke -uterine fibroids -an unusual or allergic reaction to tamoxifen, other medicines, foods, dyes, or preservatives -pregnant or trying to get pregnant -breast-feeding How should I use this medicine? Take this medicine by mouth with a glass of water. Follow the directions on the prescription label. You  can take it with or without food. Take your medicine at regular intervals. Do not take your medicine more often than directed. Do not stop taking except on your doctor's advice. A special MedGuide will be given to you by the pharmacist with each prescription and refill. Be sure to read this information carefully each time. Talk to your pediatrician regarding the use of this medicine in children. While this drug may be prescribed for selected conditions, precautions do apply. Overdosage: If you think you have taken too much of this medicine contact a poison control center or emergency room at once. NOTE: This medicine is only for you. Do not share this medicine with others. What if I miss a dose? If you miss a dose, take it as soon as you can. If it is almost time for your next dose, take only that dose. Do not take double or extra doses. What may interact with this medicine? -aminoglutethimide -bromocriptine -chemotherapy drugs -female hormones, like estrogens and birth control pills -letrozole -medroxyprogesterone -phenobarbital -rifampin -warfarin This list may not describe all possible interactions. Give your health care provider a list of all the medicines, herbs, non-prescription drugs, or dietary supplements you use. Also tell them if you smoke, drink alcohol, or use illegal drugs. Some items may interact with your medicine. What should I watch for while using this medicine? Visit your doctor or health care professional for regular checks on your progress. You will need regular pelvic exams, breast exams, and mammograms. If you are taking this medicine to reduce your risk of getting breast cancer, you should know that this medicine does not prevent all types of breast cancer. If breast cancer or  other problems occur, there is no guarantee that it will be found at an early stage. Do not become pregnant while taking this medicine or for 2 months after stopping this medicine. Stop taking this  medicine if you get pregnant or think you are pregnant and contact your doctor. This medicine may harm your unborn baby. Women who can possibly become pregnant should use birth control methods that do not use hormones during tamoxifen treatment and for 2 months after therapy has stopped. Talk with your health care provider for birth control advice. Do not breast feed while taking this medicine. What side effects may I notice from receiving this medicine? Side effects that you should report to your doctor or health care professional as soon as possible: -changes in vision (blurred vision) -changes in your menstrual cycle -difficulty breathing or shortness of breath -difficulty walking or talking -new breast lumps -numbness -pelvic pain or pressure -redness, blistering, peeling or loosening of the skin, including inside the mouth -skin rash or itching (hives) -sudden chest pain -swelling of lips, face, or tongue -swelling, pain or tenderness in your calf or leg -unusual bruising or bleeding -vaginal discharge that is bloody, brown, or rust -weakness -yellowing of the whites of the eyes or skin Side effects that usually do not require medical attention (report to your doctor or health care professional if they continue or are bothersome): -fatigue -hair loss, although uncommon and is usually mild -headache -hot flashes -impotence (in men) -nausea, vomiting (mild) -vaginal discharge (white or clear) This list may not describe all possible side effects. Call your doctor for medical advice about side effects. You may report side effects to FDA at 1-800-FDA-1088. Where should I keep my medicine? Keep out of the reach of children. Store at room temperature between 20 and 25 degrees C (68 and 77 degrees F). Protect from light. Keep container tightly closed. Throw away any unused medicine after the expiration date. NOTE: This sheet is a summary. It may not cover all possible information. If you  have questions about this medicine, talk to your doctor, pharmacist, or health care provider.  2012, [employer]. (10/03/2007 12:01:56 PM)

## 2012-07-18 NOTE — Progress Notes (Signed)
Kathleen Harris [id number] Oct 02, 1945 67 y.o. 07/18/2012 3:56 PM  CC  [name], MD [phone] W. [facility] [address] [place] [address] Dr. Maisie Fus Cornett Dr. Konrad Dolores Dr. Chipper Herb  REASON FOR CONSULTATION:  67 year old female with new diagnosis of stage I right breast cancer diagnosis on 06/27/12   STAGE:  Breast cancer of upper-outer quadrant of left female breast   Primary site: Breast (Right)   Staging method: AJCC 7th Edition   Clinical: Stage IA (T1b, N0, cM0)   Pathologic: Stage IA (T1b, N0(i-), cM0) signed by Victorino December, MD on 08/11/2012  1:41 PM   Summary: Stage IA (T1b, N0(i-), cM0)   REFERRING PHYSICIAN: Dr. Maisie Fus Cornett  HISTORY OF PRESENT ILLNESS:  Kathleen Harris is a 67 y.o. female.  Patient had a screening mammogram performed in April to thousand and 14 at the breast Center. She was found to have a possible architectural distortion within the right breast. 06/04/2012 that showed persistent architectural distortion with a 0.6 cm a hypoechoic lesion at 10:00 within the right breast on ultrasound. She had an ultrasound-guided core biopsy performed on 06/04/2012. 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 Tumor was ER +100% PR +100% with a Ki-67 15% and HER-2/neu negative. On 06/10/2012 patient had MRI of the breasts performed that showed a 1.4 cm mass within the upper-outer quadrant of the right breast in the middle third without other areas. On 06/27/2012 patient underwent a right partial mastectomy and sentinel lymph node biopsy.the final pathology revealed a 0.9 cm grade 1 invasive ductal carcinoma with DCIS 2 sentinel nodes were negative for metastatic disease. Patient has been seen by Dr. Chipper Herb for consideration of adjuvant radiation therapy. She is now seen in medical oncology for discussion of adjuvant systemic treatment. She is without any complaints but her   Past Medical History: Past Medical  History  Diagnosis Date  . Wears glasses   . Medical history non-contributory   . Allergy   . Arthritis     fingers  . Asthma     as a child last attack teenager  . Irregular heart beat   . Hot flashes     Past Surgical History: Past Surgical History  Procedure Laterality Date  . Tonsillectomy and adenoidectomy    . Appendectomy    . Breast surgery  2010    Several lumectomies.lt  . Colonoscopy    . Breast lumpectomy with needle localization and axillary sentinel lymph node bx Right 06/27/2012    Procedure: BREAST LUMPECTOMY WITH NEEDLE LOCALIZATION AND AXILLARY SENTINEL LYMPH NODE BX remove skin lesion right breast ;  Surgeon: Clovis Pu. Cornett, MD;  Location: [facility];  Service: General;  Laterality: Right;  11:00 needle localization at BCG  1:00 nuclear medicine injection   . Abdominal hysterectomy  1998    b/l oopherectomt    Family History: Family History  Problem Relation Age of Onset  . Cancer Mother     Breast  . Cancer Father     Colon  . Cancer Sister     Breast  . Heart attack Brother     Social History History  Substance Use Topics  . Smoking status: Former Smoker    Types: Cigarettes    Quit date: 01/31/1971  . Smokeless tobacco: Never Used  . Alcohol Use: No    Allergies: Allergies  Allergen Reactions  . Latex Rash  . Penicillins Rash  . Sulfonamide  Derivatives Nausea Only    Current Medications: Current Outpatient Prescriptions  Medication Sig Dispense Refill  . Ascorbic Acid (VITAMIN C PO) Take by mouth.      Marland Kitchen aspirin 81 MG tablet Take 81 mg by mouth daily.      Marland Kitchen b complex vitamins tablet Take 1 tablet by mouth daily.      . calcium & magnesium carbonates (MYLANTA) 311-232 MG per tablet Take 1 tablet by mouth daily.      . cholecalciferol (VITAMIN D) 1000 UNITS tablet Take 1,000 Units by mouth daily. d3      . ibuprofen (ADVIL,MOTRIN) 200 MG tablet Take 200 mg by mouth every 6 (six) hours as needed for pain.      .  Multiple Vitamins-Minerals (MULTIVITAMIN WITH MINERALS) tablet Take 1 tablet by mouth daily.      Marland Kitchen OVER THE COUNTER MEDICATION Take 2 capsules by mouth 2 (two) times daily. 2 capsules in am, 3rd capsule 2pm daily      . PHYTOSTEROLS PO Take 2 capsules by mouth 2 (two) times daily. 1 in am 1 in afternoon      . vitamin C (ASCORBIC ACID) 500 MG tablet Take 500 mg by mouth daily.       No current facility-administered medications for this visit.    OB/GYN History: menarche at age 1, surgical menopause at 62, HRT for 7 years, G2p2 age at first live birth 76,  Fertility Discussion: completed family Prior History of Cancer: none  Health Maintenance:  Colonoscopy yes Bone Density yes in 50's Last PAP smear none  ECOG PERFORMANCE STATUS: 0 - Asymptomatic  Genetic Counseling/testing:   REVIEW OF SYSTEMS:  A comprehensive 14 point review of systems was obtained and it is scant separately into the electronic medical record  PHYSICAL EXAMINATION: Blood pressure 135/80, pulse 74, temperature 98.3 F (36.8 C), temperature source Oral, resp. rate 20, height 5\' 3"  (1.6 m), weight 158 lb 9.6 oz (71.94 kg).  Well-developed well-nourished female in no acute distress HEENT exam EOMI PERRLA sclerae anicteric no conjunctival pallor oral mucosa is moist neck is supple lungs are clear bilaterally to auscultation cardiovascular is regular rate rhythm no murmurs gallops or rubs abdomen is soft nontender nondistended bowel sounds are present no HSM extremities no clubbing edema or cyanosis. Neuro is nonfocal Left breast no masses or nipple discharge. Right breast reveals a well-healed surgical scar no nodularity no masses or nipple discharge.    STUDIES/RESULTS: Nm Sentinel Node Inj-no Rpt (breast)  06/27/2012   CLINICAL DATA: cancer   Sulfur colloid was injected intradermally by the nuclear medicine  technologist for breast cancer sentinel node localization.    Mm Rt Plc Breast Loc Dev   1st Lesion   Inc Mammo Guide  06/27/2012   *RADIOLOGY REPORT*  Clinical Data:  Right breast cancer.  NEEDLE LOCALIZATION WITH MAMMOGRAPHIC GUIDANCE AND SPECIMEN RADIOGRAPH  Comparison:  Previous exams.  Patient presents for needle localization prior to lumpectomy.  I met with the patient and we discussed the procedure of needle localization including benefits and alternatives. We discussed the high likelihood of a successful procedure. We discussed the risks of the procedure, including infection, bleeding, tissue injury, and further surgery. Informed, written consent was given.  Using mammographic guidance, sterile technique, 2% lidocaine and a 7 cm modified Kopans needle, a biopsy clip was localized using a lateral to medial approach.  The films are marked for Dr. Luisa Hart.  Specimen radiograph was performed at [facility], and confirms  the biopsy clip, a small density, and an intact wire present in the tissue sample.  The specimen is marked for pathology.  The usual time-out protocol was performed immediately prior to the procedure.  IMPRESSION: Needle localization right breast.  No apparent complications.   Original Report Authenticated By: Britta Mccreedy, M.D.     LABS:    Chemistry      Component Value Date/Time   NA 140 02/12/2009 1525   K 4.6 02/12/2009 1525   CL 98 02/12/2009 1525   CO2 30 02/12/2009 1525   BUN 12 02/12/2009 1525   CREATININE 0.74 02/12/2009 1525      Component Value Date/Time   CALCIUM 10.7* 02/12/2009 1525   ALKPHOS 58 02/16/2006 1157   AST 25 02/16/2006 1157   ALT 29 02/16/2006 1157   BILITOT 0.8 02/16/2006 1157      Lab Results  Component Value Date   WBC 9.8 03/18/2009   HGB 14.5 03/18/2009   HCT 42.4 03/18/2009   MCV 93.7 03/18/2009   PLT 366.0 03/18/2009   PATHOLOGY: ADDITIONAL INFORMATION: 4. CHROMOGENIC IN-SITU HYBRIDIZATION Results: HER-2/NEU BY CISH - NO AMPLIFICATION OF HER-2 DETECTED. RESULT RATIO OF HER2: CEP 17 SIGNALS 0.88 AVERAGE HER2 COPY NUMBER PER CELL  1.85 REFERENCE RANGE NEGATIVE HER2/Chr17 Ratio <2.0 and Average HER2 copy number <4.0 EQUIVOCAL HER2/Chr17 Ratio <2.0 and Average HER2 copy number 4.0 and <6.0 POSITIVE HER2/Chr17 Ratio >=2.0 and/or Average HER2 copy number >=6.0 Jimmy Picket MD Pathologist, Electronic Signature ( Signed 07/05/2012) FINAL DIAGNOSIS Diagnosis 1. Lymph node, sentinel, biopsy, Right axilla - ONE LYMPH NODE, NEGATIVE FOR METASTATIC CARCINOMA (0/1). 2. Lymph node, sentinel, biopsy, Right axilla - ONE LYMPH NODE, NEGATIVE FOR METASTATIC CARCINOMA (0/1). 3. Skin , Right breast - MELANOCYTIC NEVUS, COMPOUND TYPE , EXTENDING TO THE LATERAL EDGES. 4. Breast, lumpectomy, Right - INVASIVE DUCTAL CARCINOMA, NOTTINGHAM COMBINED HISTOLOGIC GRADE I, 0.9 CM, 0.1 CM FROM THE INKED ANTERIOR MARGIN. - COMPLEX SCLEROSING LESION INCLUDING USUAL DUCTAL HYPERPLASIA, INTRADUCTAL PAPILLOMA, FIBROSIS AND CYSTS ASSOCIATED WITH MICROCALCIFICATIONS. 5. Breast, excision, Right extra anterior margin 1 of 4 FINAL for Ossa, Marylyn B ([id number]) Diagnosis(continued) - INTRADUCTAL PAPILLOMA AND FIBROCYSTIC CHANGES INCLUDING USUAL DUCTAL HYPERPLASIA, STROMAL FIBROSIS AND ADENOSIS ASSOCIATED WITH MICROCALCIFICATIONS. - INKED ADDITIONAL ANTERIOR MARGIN, NEGATIVE FOR ATYPIA OR MALIGNANCY. Microscopic Comment 3. There is a proliferation of banal melanocytes at the dermal epidermal junction arranged mostly in nests with nevus cells in the dermis, which are generally smaller as they are deeper. There is no significant atypia. 4. BREAST, INVASIVE TUMOR, WITH LYMPH NODE SAMPLING Specimen, including laterality: Right breast Procedure: Lumpectomy Grade: I Tubule formation: 1 Nuclear pleomorphism: 2 Mitotic: 1 Tumor size ( glass slide measurement): 0.9 cm Margins: Negative Invasive, distance to closest margin: Inferior and posteior margins: 0.9 cm In-situ, distance to closest margin: N/A Lymphovascular invasion: Not identified Ductal  carcinoma in situ: N/A Lobular neoplasia: N/A Tumor focality: Unifocal Treatment effect: No Extent of tumor: Skin: N/A Nipple: N/A Skeletal muscle: N/A Lymph nodes: # examined: 2 Lymph nodes with metastasis: 0 Isolated tumor cells (< 0.2 mm): 0 Micrometastasis: (> 0.2 mm and < 2.0 mm): 0 Macrometastasis: (> 2.0 mm): 0 Extracapsular extension: 0 Breast prognostic profile: Please correlate with previous case [phone] Estrogen receptor: 100%, positive, strong staining intensity Progesterone receptor: 100%, positive, strong staining intensity Her 2 neu: No amplification (ratio of Her2:CEP 17 signals is 1.00, average Her 2 copy number per cell is 1.50). Ki-67: 15% Non-neoplastic breast: Complex sclerosing lesion including intraductal papilloma, usual ductal  hyperplasia, fibrocystic changes with associated microcalcification. TNM: pT1b, pN0 (sn-), pNX Comments: Her 2 neu will be repeated and an addendum report will follow. (HCL:kh 07-02-12) ASSESSMENT    67 year old female with  #1 stage I (T1 B. N0) invasive ductal carcinoma of the right breast diagnosed may 2014. Patient is status post lumpectomy with sentinel lymph node biopsy. Final pathology did reveal a 0.9 cm invasive ductal carcinoma that was ER positive PR positive HER-2/neu negative with a low Ki-67 of 50%. Initial margin was positive but she has had a reexcision. She is now margin negative as well. Postoperatively she is doing well.  #2 patient has been seen by Dr. Chipper Herb for adjuvant radiation therapy for local control and 8 be with this.  #3 patient and I discussed role of adjuvant systemic therapy. I do not think that she needs chemotherapy because of the size of her tumor and favorable prognostic markers. Therefore I would recommend antiestrogen therapy consisting of tamoxifen 20 mg daily. We are choosing this as patient does have history of arthritis and other musculoskeletal issues. I do think that she will tolerate  tamoxifen quite nicely.Risks and benefits of this were discussed with the patient. Literature is given to her in detail.  Clinical Trial Eligibility:I Multidisciplinary conference discussion yes     PLAN:    #1We discussed your radiology, pathology and diagnosis.  #2 Discuss treatment of breast cancer including surgery and radiation.  #3 We discussed use of antiestrogen therapy to help prevent breast cancer recurrence. We discussed using tamoxifen 20 mg daily for a total of 5 years after radiation is completed. We also discussed the other antiestrogen therapy skull the aromatase inhibitors which include Arimidex Aromasin and Femara.  #4 we discussed seeing you back after completion of radiation there be and then getting you started on tamoxifen. Information on tamoxifen is noted below.  #5 we discussed ways of reducing hot flashes including use of Effexor or gabapentin. We also discussed complementary ways of reducing hot flashes such as through yoga exercise and begin acupuncture.  #6 I will see you back in 4-6 weeks' time for followup.         Discussion: Patient is being treated per NCCN breast cancer care guidelines appropriate for stage.I   Thank you so much for allowing me to participate in the care of Kathleen Harris. I will continue to follow up the patient with you and assist in her care.  All questions were answered. The patient knows to call the clinic with any problems, questions or concerns. We can certainly see the patient much sooner if necessary.  I spent 55 minutes counseling the patient face to face. The total time spent in the appointment was 60 minutes.  Drue Second, MD Medical/Oncology [facility] [phone] (beeper) [phone] (Office)   07/18/2012, 3:56 PM

## 2012-07-18 NOTE — Telephone Encounter (Signed)
appts made and printed...td 

## 2012-07-22 ENCOUNTER — Ambulatory Visit
Admission: RE | Admit: 2012-07-22 | Discharge: 2012-07-22 | Disposition: A | Discharge status: Home / Self Care | Payer: BC Managed Care – PPO | Source: Ambulatory Visit | Attending: Radiation Oncology | Admitting: Radiation Oncology

## 2012-07-22 DIAGNOSIS — Z510 Encounter for antineoplastic radiation therapy: Secondary | ICD-10-CM | POA: Insufficient documentation

## 2012-07-22 DIAGNOSIS — C50412 Malignant neoplasm of upper-outer quadrant of left female breast: Secondary | ICD-10-CM

## 2012-07-22 DIAGNOSIS — C50419 Malignant neoplasm of upper-outer quadrant of unspecified female breast: Secondary | ICD-10-CM | POA: Insufficient documentation

## 2012-07-22 NOTE — Progress Notes (Signed)
Simulation/treatment planning note: The patient was taken to the CT simulator. She was placed on a custom breast board and a custom neck mold was constructed for immobilization. Her field borders were marked with radiopaque wires along with her partial mastectomy scar. She was then scanned. I chose and isocenter along the central breast. The CT data set was sent to the planning system. I contoured her partial mastectomy tumor bed. She was set up to medial and lateral right breast tangents and 2 sets of multileaf collimators were designed to conform the field. I am prescribing 4256 cGy in 16 sessions. There is no boost.

## 2012-07-23 ENCOUNTER — Telehealth: Payer: Self-pay | Admitting: Oncology

## 2012-07-29 ENCOUNTER — Ambulatory Visit: Payer: BC Managed Care – PPO | Admitting: Radiation Oncology

## 2012-07-30 ENCOUNTER — Ambulatory Visit: Payer: BC Managed Care – PPO

## 2012-07-31 ENCOUNTER — Ambulatory Visit
Admission: RE | Admit: 2012-07-31 | Discharge: 2012-07-31 | Disposition: A | Discharge status: Home / Self Care | Payer: BC Managed Care – PPO | Source: Ambulatory Visit | Attending: Radiation Oncology | Admitting: Radiation Oncology

## 2012-07-31 ENCOUNTER — Ambulatory Visit: Payer: BC Managed Care – PPO

## 2012-07-31 DIAGNOSIS — C50412 Malignant neoplasm of upper-outer quadrant of left female breast: Secondary | ICD-10-CM

## 2012-07-31 NOTE — Progress Notes (Signed)
Simulation Verification Note: The patient underwent simulation verification today for her R breast. Her isocenter was in good position and  The MLCs contoured the target volume appropriately.

## 2012-08-01 ENCOUNTER — Ambulatory Visit: Payer: BC Managed Care – PPO

## 2012-08-05 ENCOUNTER — Encounter (INDEPENDENT_AMBULATORY_CARE_PROVIDER_SITE_OTHER): Payer: Self-pay | Admitting: Surgery

## 2012-08-05 ENCOUNTER — Ambulatory Visit
Admission: RE | Admit: 2012-08-05 | Discharge: 2012-08-05 | Disposition: A | Discharge status: Home / Self Care | Payer: BC Managed Care – PPO | Source: Ambulatory Visit | Attending: Radiation Oncology | Admitting: Radiation Oncology

## 2012-08-05 ENCOUNTER — Encounter (INDEPENDENT_AMBULATORY_CARE_PROVIDER_SITE_OTHER): Payer: BC Managed Care – PPO | Admitting: Surgery

## 2012-08-05 ENCOUNTER — Ambulatory Visit (INDEPENDENT_AMBULATORY_CARE_PROVIDER_SITE_OTHER): Payer: BC Managed Care – PPO | Admitting: Surgery

## 2012-08-05 ENCOUNTER — Ambulatory Visit: Payer: BC Managed Care – PPO

## 2012-08-05 VITALS — Wt 154.4 lb

## 2012-08-05 VITALS — BP 128/72 | HR 76 | Temp 97.4°F | Resp 16 | Ht 63.0 in | Wt 153.8 lb

## 2012-08-05 DIAGNOSIS — C50412 Malignant neoplasm of upper-outer quadrant of left female breast: Secondary | ICD-10-CM

## 2012-08-05 DIAGNOSIS — Z9889 Other specified postprocedural states: Secondary | ICD-10-CM

## 2012-08-05 MED ORDER — RADIAPLEXRX EX GEL
Freq: Once | CUTANEOUS | Status: AC
  Administered 2012-08-05: 17:00:00 via TOPICAL

## 2012-08-05 MED ORDER — ALRA NON-METALLIC DEODORANT (RAD-ONC)
1.0000 "application " | Freq: Once | TOPICAL | Status: AC
  Administered 2012-08-05: 1 via TOPICAL

## 2012-08-05 NOTE — Progress Notes (Signed)
Patient returns after right breast lumpectomy and sentinel lymph node mapping stage I right breast cancer. She is doing well except swelling.  Exam: Mild ecchymoses noted the right breast. Incisions clean dry and intact. Mild swelling at the axillary site without redness. Minimal seroma at the lumpectomy site.  Impression: Status post right breast lumpectomy for stage I right breast cancer node negative  Plan: Return in 3 months.  Starting radiation  No restrictions.

## 2012-08-05 NOTE — Progress Notes (Signed)
Patient expressed she had "somewhere to be." Refused vitals and post sim. Patient promptly seen by Dr. Dayton Scrape.

## 2012-08-05 NOTE — Progress Notes (Signed)
Weekly Management Note:  Site: Right breast Current Dose:  266  cGy Projected Dose: 4256  cGy  Narrative: The patient is seen today for routine under treatment assessment. CBCT/MVCT images/port films were reviewed. The chart was reviewed.   No complaints today she was given Radioplex gel to use when necessary.  Physical Examination: There were no vitals filed for this visit..  Weight: 154 lb 6.4 oz (70.035 kg). No skin changes.  Impression: Tolerating radiation therapy well.  Plan: Continue radiation therapy as planned.

## 2012-08-05 NOTE — Progress Notes (Signed)
Provided patient with radiaplex, alra, and RADIATION THERAPY AND YOU handbook. Provided patient with this writer's contact information encouraging her to contact our staff when she has more time.

## 2012-08-05 NOTE — Patient Instructions (Signed)
Return in 2-3 months

## 2012-08-06 ENCOUNTER — Ambulatory Visit: Payer: BC Managed Care – PPO

## 2012-08-06 ENCOUNTER — Ambulatory Visit
Admission: RE | Admit: 2012-08-06 | Discharge: 2012-08-06 | Disposition: A | Discharge status: Home / Self Care | Payer: BC Managed Care – PPO | Source: Ambulatory Visit | Attending: Radiation Oncology | Admitting: Radiation Oncology

## 2012-08-07 ENCOUNTER — Ambulatory Visit: Payer: BC Managed Care – PPO

## 2012-08-07 ENCOUNTER — Ambulatory Visit
Admission: RE | Admit: 2012-08-07 | Discharge: 2012-08-07 | Disposition: A | Discharge status: Home / Self Care | Payer: BC Managed Care – PPO | Source: Ambulatory Visit | Attending: Radiation Oncology | Admitting: Radiation Oncology

## 2012-08-08 ENCOUNTER — Ambulatory Visit: Payer: BC Managed Care – PPO

## 2012-08-08 ENCOUNTER — Ambulatory Visit
Admission: RE | Admit: 2012-08-08 | Discharge: 2012-08-08 | Disposition: A | Discharge status: Home / Self Care | Payer: BC Managed Care – PPO | Source: Ambulatory Visit | Attending: Radiation Oncology | Admitting: Radiation Oncology

## 2012-08-09 ENCOUNTER — Ambulatory Visit
Admission: RE | Admit: 2012-08-09 | Discharge: 2012-08-09 | Disposition: A | Discharge status: Home / Self Care | Payer: BC Managed Care – PPO | Source: Ambulatory Visit | Attending: Radiation Oncology | Admitting: Radiation Oncology

## 2012-08-09 ENCOUNTER — Ambulatory Visit: Payer: BC Managed Care – PPO

## 2012-08-12 ENCOUNTER — Ambulatory Visit
Admission: RE | Admit: 2012-08-12 | Discharge: 2012-08-12 | Disposition: A | Discharge status: Home / Self Care | Payer: BC Managed Care – PPO | Source: Ambulatory Visit | Attending: Radiation Oncology | Admitting: Radiation Oncology

## 2012-08-12 ENCOUNTER — Encounter: Payer: Self-pay | Admitting: Radiation Oncology

## 2012-08-12 ENCOUNTER — Ambulatory Visit: Payer: BC Managed Care – PPO

## 2012-08-12 VITALS — BP 111/47 | HR 84 | Temp 98.2°F | Resp 20 | Wt 155.1 lb

## 2012-08-12 DIAGNOSIS — C50412 Malignant neoplasm of upper-outer quadrant of left female breast: Secondary | ICD-10-CM

## 2012-08-12 NOTE — Progress Notes (Signed)
Weekly rad tx rt breast, 6 tx done so far, slight erythema, not using radiaplex as yet, will start, no pain,  4:37 PM

## 2012-08-12 NOTE — Progress Notes (Signed)
Weekly Management Note:  Site: Left breast Current Dose:  1596  cGy Projected Dose: 4256  cGy  Narrative: The patient is seen today for routine under treatment assessment. CBCT/MVCT images/port films were reviewed. The chart was reviewed.   She is without complaints today. She has not yet started Radioplex gel.  Physical Examination:  Filed Vitals:   08/12/12 1634  BP: 111/47  Pulse: 84  Temp: 98.2 F (36.8 C)  Resp: 20  .  Weight: 155 lb 1.6 oz (70.353 kg). Faint erythema the skin with no areas of desquamation.  Impression: Tolerating radiation therapy well.  Plan: Continue radiation therapy as planned.

## 2012-08-13 ENCOUNTER — Ambulatory Visit
Admission: RE | Admit: 2012-08-13 | Discharge: 2012-08-13 | Disposition: A | Discharge status: Home / Self Care | Payer: BC Managed Care – PPO | Source: Ambulatory Visit | Attending: Radiation Oncology | Admitting: Radiation Oncology

## 2012-08-13 ENCOUNTER — Ambulatory Visit: Payer: BC Managed Care – PPO

## 2012-08-13 ENCOUNTER — Encounter: Payer: Self-pay | Admitting: *Deleted

## 2012-08-13 NOTE — Progress Notes (Signed)
Mailed after appt letter to pt. 

## 2012-08-14 ENCOUNTER — Ambulatory Visit
Admission: RE | Admit: 2012-08-14 | Discharge: 2012-08-14 | Disposition: A | Discharge status: Home / Self Care | Payer: BC Managed Care – PPO | Source: Ambulatory Visit | Attending: Radiation Oncology | Admitting: Radiation Oncology

## 2012-08-14 ENCOUNTER — Ambulatory Visit: Payer: BC Managed Care – PPO

## 2012-08-15 ENCOUNTER — Ambulatory Visit: Payer: BC Managed Care – PPO

## 2012-08-15 ENCOUNTER — Ambulatory Visit
Admission: RE | Admit: 2012-08-15 | Discharge: 2012-08-15 | Disposition: A | Discharge status: Home / Self Care | Payer: BC Managed Care – PPO | Source: Ambulatory Visit | Attending: Radiation Oncology | Admitting: Radiation Oncology

## 2012-08-16 ENCOUNTER — Ambulatory Visit: Payer: BC Managed Care – PPO

## 2012-08-16 ENCOUNTER — Ambulatory Visit
Admission: RE | Admit: 2012-08-16 | Discharge: 2012-08-16 | Disposition: A | Discharge status: Home / Self Care | Payer: BC Managed Care – PPO | Source: Ambulatory Visit | Attending: Radiation Oncology | Admitting: Radiation Oncology

## 2012-08-19 ENCOUNTER — Ambulatory Visit
Admission: RE | Admit: 2012-08-19 | Discharge: 2012-08-19 | Disposition: A | Discharge status: Home / Self Care | Payer: BC Managed Care – PPO | Source: Ambulatory Visit | Attending: Radiation Oncology | Admitting: Radiation Oncology

## 2012-08-19 ENCOUNTER — Ambulatory Visit: Payer: BC Managed Care – PPO

## 2012-08-20 ENCOUNTER — Ambulatory Visit
Admission: RE | Admit: 2012-08-20 | Discharge: 2012-08-20 | Disposition: A | Discharge status: Home / Self Care | Payer: BC Managed Care – PPO | Source: Ambulatory Visit | Attending: Radiation Oncology | Admitting: Radiation Oncology

## 2012-08-20 ENCOUNTER — Ambulatory Visit: Payer: BC Managed Care – PPO

## 2012-08-20 NOTE — Progress Notes (Addendum)
Kathleen Harris here for weekly under treat visit.  She states that she is very angry that she has to see the doctor.  She states that she doesn't have any problems and if she did she would tell the therapists.  She was willing to get a weight but would not allow vitals to be taken.  She stated she has not had any medication changes and that her skin is intact.  She did not want to be examined.  She stated that she should not have to come over to see the doctor every week and said that it is "just so they can bill me more."  Paged Dr. Basilio Cairo that patient was mad.  Advised patient Dr. Basilio Cairo would be in to see her.  Left the room.  Patient left the clinic before seeing Dr. Basilio Cairo.  Notified Dr. Basilio Cairo of situation.

## 2012-08-21 ENCOUNTER — Ambulatory Visit
Admission: RE | Admit: 2012-08-21 | Discharge: 2012-08-21 | Disposition: A | Discharge status: Home / Self Care | Payer: BC Managed Care – PPO | Source: Ambulatory Visit | Attending: Radiation Oncology | Admitting: Radiation Oncology

## 2012-08-21 ENCOUNTER — Ambulatory Visit: Payer: BC Managed Care – PPO

## 2012-08-22 ENCOUNTER — Ambulatory Visit
Admission: RE | Admit: 2012-08-22 | Discharge: 2012-08-22 | Disposition: A | Discharge status: Home / Self Care | Payer: BC Managed Care – PPO | Source: Ambulatory Visit | Attending: Radiation Oncology | Admitting: Radiation Oncology

## 2012-08-23 ENCOUNTER — Ambulatory Visit
Admission: RE | Admit: 2012-08-23 | Discharge: 2012-08-23 | Disposition: A | Discharge status: Home / Self Care | Payer: BC Managed Care – PPO | Source: Ambulatory Visit | Attending: Radiation Oncology | Admitting: Radiation Oncology

## 2012-08-23 DIAGNOSIS — C50412 Malignant neoplasm of upper-outer quadrant of left female breast: Secondary | ICD-10-CM

## 2012-08-23 NOTE — Progress Notes (Signed)
  Radiation Oncology         [phone] ________________________________  Name: Kathleen Harris MRN: [id number]  Date: 08/23/2012  DOB: August 06, 1945  Weekly Radiation Therapy Management  Current Dose: 39.9 Gy     Planned Dose:  42.56 Gy  Narrative . . . . . . . . The patient was scheduled for routine under treatment assessment today, but, she refused to stay in the clinic to see a physician.  I telephoned her after and talked to her about her course of treatment.  I asked if she had any concerns or physical complaints.  She said she was tolerating radiation well.                                                      The patient is without complaint.                                 Set-up films were reviewed.                                 The chart was checked. Physical Findings. . . The patient indicated that her physical findings were OK.. Impression . . . . . . . The patient is tolerating radiation. Plan . . . . . . . . . . . . Continue treatment as planned.  ________________________________  Artist Pais. Kathrynn Running, M.D.

## 2012-08-26 ENCOUNTER — Ambulatory Visit
Admission: RE | Admit: 2012-08-26 | Discharge: 2012-08-26 | Disposition: A | Discharge status: Home / Self Care | Payer: BC Managed Care – PPO | Source: Ambulatory Visit | Attending: Radiation Oncology | Admitting: Radiation Oncology

## 2012-08-26 ENCOUNTER — Encounter: Payer: Self-pay | Admitting: Radiation Oncology

## 2012-08-26 ENCOUNTER — Ambulatory Visit: Payer: BC Managed Care – PPO | Admitting: Oncology

## 2012-08-26 NOTE — Progress Notes (Signed)
[facility] Radiation Oncology End of Treatment Note  Name:Kathleen Harris  Date: 08/26/2012 ZOX:[id number] DOB:[birth year]   Status:outpatient    CC: [name], MD  Dr. Harriette Bouillon  REFERRING PHYSICIAN:   Dr. Harriette Bouillon   DIAGNOSIS:  Stage I (T1b N0 M0) invasive ductal carcinoma the right breast  INDICATION FOR TREATMENT: Curative   TREATMENT DATES: 08/05/2012 through 08/26/2012                          SITE/DOSE:   Right breast 4256 cGy 16 sessions  (hypofractionated)                      BEAMS/ENERGY:  6 MV photons tangential fields to the right breast                 NARRATIVE: The patient tolerated treatment well and used Radioplex gel during course of therapy.                           PLAN: Routine followup in one month. Patient instructed to call if questions or worsening complaints in interim.

## 2012-08-26 NOTE — Addendum Note (Signed)
Encounter addended by: Agnes Lawrence, RN on: 08/26/2012 10:29 AM<BR>     Documentation filed: Notes Section

## 2012-08-26 NOTE — Progress Notes (Signed)
Late entry from 08/23/2012 at 1549. Received call from [facility] on [address]. Herbert Seta reports this patient refused to be seen by a physician. Heather, RT reports that Monday, July 28 the patient will be outside her block. Herbert Seta states, "the patient says she won't stay because she waited Wednesday and Thursday to be seen for a very long time then, left without being seen." Routed message to Dr. Dayton Scrape.

## 2012-09-05 ENCOUNTER — Encounter: Payer: Self-pay | Admitting: Radiation Oncology

## 2012-09-05 NOTE — Progress Notes (Signed)
Received call from patient, she has a cancer policy and needs an itemized statement.  Printed and mailed I-bill to patient today.

## 2012-09-06 ENCOUNTER — Ambulatory Visit (HOSPITAL_BASED_OUTPATIENT_CLINIC_OR_DEPARTMENT_OTHER): Payer: BC Managed Care – PPO | Admitting: Oncology

## 2012-09-06 ENCOUNTER — Telehealth: Payer: Self-pay | Admitting: Oncology

## 2012-09-06 ENCOUNTER — Ambulatory Visit (HOSPITAL_BASED_OUTPATIENT_CLINIC_OR_DEPARTMENT_OTHER): Payer: BC Managed Care – PPO | Admitting: Lab

## 2012-09-06 ENCOUNTER — Encounter: Payer: Self-pay | Admitting: Oncology

## 2012-09-06 VITALS — BP 109/68 | HR 88 | Temp 97.9°F | Resp 18 | Ht 63.0 in | Wt 149.4 lb

## 2012-09-06 DIAGNOSIS — C50419 Malignant neoplasm of upper-outer quadrant of unspecified female breast: Secondary | ICD-10-CM

## 2012-09-06 DIAGNOSIS — Z170 Estrogen receptor positive status [ER+]: Secondary | ICD-10-CM

## 2012-09-06 DIAGNOSIS — C50412 Malignant neoplasm of upper-outer quadrant of left female breast: Secondary | ICD-10-CM

## 2012-09-06 LAB — COMPREHENSIVE METABOLIC PANEL (CC13)
AST: 25 U/L (ref 5–34)
Albumin: 4.1 g/dL (ref 3.5–5.0)
Alkaline Phosphatase: 76 U/L (ref 40–150)
BUN: 19.3 mg/dL (ref 7.0–26.0)
Potassium: 4.2 mEq/L (ref 3.5–5.1)
Sodium: 140 mEq/L (ref 136–145)
Total Bilirubin: 0.44 mg/dL (ref 0.20–1.20)

## 2012-09-06 LAB — CBC WITH DIFFERENTIAL/PLATELET
Basophils Absolute: 0 10*3/uL (ref 0.0–0.1)
EOS%: 2.4 % (ref 0.0–7.0)
LYMPH%: 30.8 % (ref 14.0–49.7)
MCH: 32.3 pg (ref 25.1–34.0)
MCV: 92.8 fL (ref 79.5–101.0)
MONO%: 10.3 % (ref 0.0–14.0)
RBC: 4.54 10*6/uL (ref 3.70–5.45)
RDW: 12.8 % (ref 11.2–14.5)

## 2012-09-06 MED ORDER — TAMOXIFEN CITRATE 20 MG PO TABS: 20.0000 mg | ORAL_TABLET | Freq: Every day | ORAL | Status: DC

## 2012-09-06 NOTE — Progress Notes (Signed)
OFFICE PROGRESS NOTE  CC**  [name], MD [phone] W. [facility] [address] [place] [address] Dr. Maisie Fus Cornett  Dr. Konrad Dolores  Dr. Chipper Herb  DIAGNOSIS: 67 year old female with new diagnosis of stage I right breast cancer diagnosis on 06/27/12   STAGE:  Breast cancer of upper-outer quadrant of left female breast  Primary site: Breast (Right)  Staging method: AJCC 7th Edition  Clinical: Stage IA (T1b, N0, cM0)  Pathologic: Stage IA (T1b, N0(i-), cM0) signed by Victorino December, MD on 08/11/2012 1:41 PM  Summary: Stage IA (T1b, N0(i-), cM0)   PRIOR THERAPY: #1 Patient had a screening mammogram performed in April to thousand and 14 at the [facility]. She was found to have a possible architectural distortion within the right breast. 06/04/2012 that showed persistent architectural distortion with a 0.6 cm a hypoechoic lesion at 10:00 within the right breast on ultrasound. She had an ultrasound-guided core biopsy performed on 06/04/2012. 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 Tumor was ER +100% PR +100% with a Ki-67 15% and HER-2/neu negative. On 06/10/2012 patient had MRI of the breasts performed that showed a 1.4 cm mass within the upper-outer quadrant of the right breast in the middle third without other areas. 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 We discussed the side effects risks and benefits of this treatment.   CURRENT THERAPY:tamoxifen 20 mg daily starting a day 2014  INTERVAL HISTORY: Kathleen Harris 67 y.o. female returns forfollowup visit after completing radiation. Overall she tolerated it well without any  complaints. She denies any nausea vomiting fevers chills night sweats headaches shortness of breath chest pains palpitations no myalgias and arthralgias remainder of the 10 point review of systems is negative.  MEDICAL HISTORY: Past Medical History  Diagnosis Date  . Wears glasses   . Medical history non-contributory   . Allergy   . Arthritis     fingers  . Asthma     as a child last attack teenager  . Irregular heart beat   . Hot flashes     ALLERGIES:  is allergic to latex; penicillins; and sulfonamide derivatives.  MEDICATIONS:  Current Outpatient Prescriptions  Medication Sig Dispense Refill  . aspirin 81 MG tablet Take 81 mg by mouth daily.      . calcium & magnesium carbonates (MYLANTA) 311-232 MG per tablet Take 1 tablet by mouth daily.      . cholecalciferol (VITAMIN D) 1000 UNITS tablet Take 1,000 Units by mouth daily. d3      . hyaluronate sodium (RADIAPLEXRX) GEL Apply topically 2 (two) times daily.      Marland Kitchen ibuprofen (ADVIL,MOTRIN) 200 MG tablet Take 200 mg by mouth every 6 (six) hours as needed for pain.      . Multiple Vitamins-Minerals (MULTIVITAMIN WITH MINERALS) tablet Take 1 tablet by mouth daily.      . non-metallic deodorant Thornton Papas) MISC Apply 1 application topically daily as needed.      Marland Kitchen OVER THE COUNTER MEDICATION Take 2 capsules by mouth 2 (two) times daily. Green Miracle: 2 capsules in am, 3rd capsule 2pm daily      . OVER THE COUNTER MEDICATION  Take by mouth 2 (two) times daily. Arctic Cod Liver - Omega 3      . PHYTOSTEROLS PO Take 2 capsules by mouth 2 (two) times daily. 1 in am 1 in afternoon      . Probiotic Product (PROBIOTIC DAILY PO) Take by mouth daily.      . vitamin C (ASCORBIC ACID) 500 MG tablet Take 500 mg by mouth daily.      Marland Kitchen b complex vitamins tablet Take 1 tablet by mouth daily.       No current facility-administered medications for this visit.    SURGICAL HISTORY:  Past Surgical History  Procedure Laterality Date  . Tonsillectomy and  adenoidectomy    . Appendectomy    . Breast surgery  2010    Several lumectomies.lt  . Colonoscopy    . Breast lumpectomy with needle localization and axillary sentinel lymph node bx Right 06/27/2012    Procedure: BREAST LUMPECTOMY WITH NEEDLE LOCALIZATION AND AXILLARY SENTINEL LYMPH NODE BX remove skin lesion right breast ;  Surgeon: Clovis Pu. Cornett, MD;  Location: [facility];  Service: General;  Laterality: Right;  11:00 needle localization at BCG  1:00 nuclear medicine injection   . Abdominal hysterectomy  1998    b/l oopherectomt    REVIEW OF SYSTEMS:  Pertinent items are noted in HPI.   HEALTH MAINTENANCE:  PHYSICAL EXAMINATION: Blood pressure 109/68, pulse 88, temperature 97.9 F (36.6 C), temperature source Oral, resp. rate 18, height 5\' 3"  (1.6 m), weight 149 lb 6.4 oz (67.767 kg). Body mass index is 26.47 kg/(m^2). ECOG PERFORMANCE STATUS: 0 - Asymptomatic   General appearance: alert, cooperative and appears stated age Resp: clear to auscultation bilaterally and normal percussion bilaterally Cardio: regular rate and rhythm GI: soft, non-tender; bowel sounds normal; no masses,  no organomegaly Extremities: extremities normal, atraumatic, no cyanosis or edema Neurologic: Grossly normal   LABORATORY DATA: Lab Results  Component Value Date   WBC 9.8 03/18/2009   HGB 14.5 03/18/2009   HCT 42.4 03/18/2009   MCV 93.7 03/18/2009   PLT 366.0 03/18/2009      Chemistry      Component Value Date/Time   NA 140 02/12/2009 1525   K 4.6 02/12/2009 1525   CL 98 02/12/2009 1525   CO2 30 02/12/2009 1525   BUN 12 02/12/2009 1525   CREATININE 0.74 02/12/2009 1525      Component Value Date/Time   CALCIUM 10.7* 02/12/2009 1525   ALKPHOS 58 02/16/2006 1157   AST 25 02/16/2006 1157   ALT 29 02/16/2006 1157   BILITOT 0.8 02/16/2006 1157       RADIOGRAPHIC STUDIES:  No results found.  ASSESSMENT: 67 year old female with  #1 stage I invasive ductal carcinoma of the left  breast diagnosed Jun 27 2012. Patient is status post lumpectomy followed by radiation therapy. Overall she tolerated the radiation well without any significant problems. Patient's tumor was ER positive. There for she will now begin adjuvant antiestrogen therapy consisting of tamoxifen 20 mg daily. We discussed the risks benefits and side effects of it. Total of 10 years of therapy is planned.   PLAN:   #1 proceed with tamoxifen 20 mg daily starting today.  #2 I will see you back in 3 months time for followup.   All questions were answered. The patient knows to call the clinic with any problems, questions or concerns. We can certainly see the patient much sooner if necessary.  I spent 25 minutes counseling the  patient face to face. The total time spent in the appointment was 30 minutes.    Drue Second, MD Medical/Oncology [facility] [phone] (beeper) [phone] (Office)  09/06/2012, 3:11 PM

## 2012-09-06 NOTE — Patient Instructions (Addendum)
Proceed with tamoxifen 20 mg daily   We discussed the side effects and benefits of the treatment  I will see you back in 3 months  Tamoxifen oral tablet What is this medicine? TAMOXIFEN (ta MOX i fen) blocks the effects of estrogen. It is commonly used to treat breast cancer. It is also used to decrease the chance of breast cancer coming back in women who have received treatment for the disease. It may also help prevent breast cancer in women who have a high risk of developing breast cancer. This medicine may be used for other purposes; ask your health care provider or pharmacist if you have questions. What should I tell my health care provider before I take this medicine? They need to know if you have any of these conditions: -blood clots -blood disease -cataracts or impaired eyesight -endometriosis -high calcium levels -high cholesterol -irregular menstrual cycles -liver disease -stroke -uterine fibroids -an unusual or allergic reaction to tamoxifen, other medicines, foods, dyes, or preservatives -pregnant or trying to get pregnant -breast-feeding How should I use this medicine? Take this medicine by mouth with a glass of water. Follow the directions on the prescription label. You can take it with or without food. Take your medicine at regular intervals. Do not take your medicine more often than directed. Do not stop taking except on your doctor's advice. A special MedGuide will be given to you by the pharmacist with each prescription and refill. Be sure to read this information carefully each time. Talk to your pediatrician regarding the use of this medicine in children. While this drug may be prescribed for selected conditions, precautions do apply. Overdosage: If you think you have taken too much of this medicine contact a poison control center or emergency room at once. NOTE: This medicine is only for you. Do not share this medicine with others. What if I miss a dose? If you  miss a dose, take it as soon as you can. If it is almost time for your next dose, take only that dose. Do not take double or extra doses. What may interact with this medicine? -aminoglutethimide -bromocriptine -chemotherapy drugs -female hormones, like estrogens and birth control pills -letrozole -medroxyprogesterone -phenobarbital -rifampin -warfarin This list may not describe all possible interactions. Give your health care provider a list of all the medicines, herbs, non-prescription drugs, or dietary supplements you use. Also tell them if you smoke, drink alcohol, or use illegal drugs. Some items may interact with your medicine. What should I watch for while using this medicine? Visit your doctor or health care professional for regular checks on your progress. You will need regular pelvic exams, breast exams, and mammograms. If you are taking this medicine to reduce your risk of getting breast cancer, you should know that this medicine does not prevent all types of breast cancer. If breast cancer or other problems occur, there is no guarantee that it will be found at an early stage. Do not become pregnant while taking this medicine or for 2 months after stopping this medicine. Stop taking this medicine if you get pregnant or think you are pregnant and contact your doctor. This medicine may harm your unborn baby. Women who can possibly become pregnant should use birth control methods that do not use hormones during tamoxifen treatment and for 2 months after therapy has stopped. Talk with your health care provider for birth control advice. Do not breast feed while taking this medicine. What side effects may I notice from receiving this  medicine? Side effects that you should report to your doctor or health care professional as soon as possible: -changes in vision (blurred vision) -changes in your menstrual cycle -difficulty breathing or shortness of breath -difficulty walking or talking -new  breast lumps -numbness -pelvic pain or pressure -redness, blistering, peeling or loosening of the skin, including inside the mouth -skin rash or itching (hives) -sudden chest pain -swelling of lips, face, or tongue -swelling, pain or tenderness in your calf or leg -unusual bruising or bleeding -vaginal discharge that is bloody, brown, or rust -weakness -yellowing of the whites of the eyes or skin Side effects that usually do not require medical attention (report to your doctor or health care professional if they continue or are bothersome): -fatigue -hair loss, although uncommon and is usually mild -headache -hot flashes -impotence (in men) -nausea, vomiting (mild) -vaginal discharge (white or clear) This list may not describe all possible side effects. Call your doctor for medical advice about side effects. You may report side effects to FDA at 1-800-FDA-1088. Where should I keep my medicine? Keep out of the reach of children. Store at room temperature between 20 and 25 degrees C (68 and 77 degrees F). Protect from light. Keep container tightly closed. Throw away any unused medicine after the expiration date. NOTE: This sheet is a summary. It may not cover all possible information. If you have questions about this medicine, talk to your doctor, pharmacist, or health care provider.  2012, Elsevier/Gold Standard. (10/03/2007 12:01:56 PM)

## 2012-09-20 ENCOUNTER — Ambulatory Visit: Payer: BC Managed Care – PPO | Admitting: Lab

## 2012-10-08 ENCOUNTER — Encounter: Payer: Self-pay | Admitting: Radiation Oncology

## 2012-10-08 ENCOUNTER — Telehealth: Payer: Self-pay | Admitting: Oncology

## 2012-10-08 NOTE — Telephone Encounter (Signed)
, °

## 2012-10-15 ENCOUNTER — Ambulatory Visit: Payer: BC Managed Care – PPO | Admitting: Radiation Oncology

## 2012-12-23 ENCOUNTER — Ambulatory Visit (INDEPENDENT_AMBULATORY_CARE_PROVIDER_SITE_OTHER): Payer: BC Managed Care – PPO | Admitting: Surgery

## 2012-12-23 ENCOUNTER — Ambulatory Visit: Payer: BC Managed Care – PPO | Admitting: Lab

## 2012-12-23 ENCOUNTER — Encounter: Payer: Self-pay | Admitting: Oncology

## 2012-12-23 ENCOUNTER — Ambulatory Visit: Payer: BC Managed Care – PPO | Admitting: Oncology

## 2012-12-23 ENCOUNTER — Ambulatory Visit (HOSPITAL_BASED_OUTPATIENT_CLINIC_OR_DEPARTMENT_OTHER): Payer: BC Managed Care – PPO | Admitting: Oncology

## 2012-12-23 ENCOUNTER — Ambulatory Visit (HOSPITAL_BASED_OUTPATIENT_CLINIC_OR_DEPARTMENT_OTHER): Payer: BC Managed Care – PPO | Admitting: Lab

## 2012-12-23 ENCOUNTER — Telehealth: Payer: Self-pay | Admitting: Oncology

## 2012-12-23 VITALS — BP 119/76 | HR 98 | Temp 98.3°F | Resp 18 | Ht 63.0 in | Wt 145.3 lb

## 2012-12-23 DIAGNOSIS — C50419 Malignant neoplasm of upper-outer quadrant of unspecified female breast: Secondary | ICD-10-CM

## 2012-12-23 DIAGNOSIS — C50412 Malignant neoplasm of upper-outer quadrant of left female breast: Secondary | ICD-10-CM

## 2012-12-23 LAB — CBC WITH DIFFERENTIAL/PLATELET
Basophils Absolute: 0 10*3/uL (ref 0.0–0.1)
EOS%: 2.5 % (ref 0.0–7.0)
Eosinophils Absolute: 0.2 10*3/uL (ref 0.0–0.5)
HGB: 14.2 g/dL (ref 11.6–15.9)
LYMPH%: 31.9 % (ref 14.0–49.7)
MCH: 31.9 pg (ref 25.1–34.0)
MCV: 93.9 fL (ref 79.5–101.0)
MONO%: 8.1 % (ref 0.0–14.0)
NEUT%: 56.8 % (ref 38.4–76.8)
Platelets: 237 10*3/uL (ref 145–400)
RDW: 12.9 % (ref 11.2–14.5)

## 2012-12-23 LAB — COMPREHENSIVE METABOLIC PANEL (CC13)
AST: 19 U/L (ref 5–34)
Alkaline Phosphatase: 59 U/L (ref 40–150)
BUN: 13.8 mg/dL (ref 7.0–26.0)
Creatinine: 0.7 mg/dL (ref 0.6–1.1)
Glucose: 179 mg/dl — ABNORMAL HIGH (ref 70–140)
Potassium: 3.7 mEq/L (ref 3.5–5.1)
Total Bilirubin: 0.32 mg/dL (ref 0.20–1.20)

## 2012-12-23 NOTE — Telephone Encounter (Signed)
, °

## 2012-12-23 NOTE — Progress Notes (Signed)
OFFICE PROGRESS NOTE  CC**  Kathleen Melnick, MD [phone] W. [facility] [address] [place] [address] Dr. Maisie Fus Harris  Dr. Konrad Harris  Dr. Chipper Harris  DIAGNOSIS: 67 year old female with new diagnosis of stage I right breast cancer diagnosis on 06/27/12   STAGE:  Breast cancer of upper-outer quadrant of left female breast  Primary site: Breast (Right)  Staging method: AJCC 7th Edition  Clinical: Stage IA (T1b, N0, cM0)  Pathologic: Stage IA (T1b, N0(i-), cM0) signed by Kathleen December, MD on 08/11/2012 1:41 PM  Summary: Stage IA (T1b, N0(i-), cM0)   PRIOR THERAPY: #1 Patient had a screening mammogram performed in April to [facility] and 14 at the [facility]. She was found to have a possible architectural distortion within the right breast. 06/04/2012 that showed persistent architectural distortion with a 0.6 cm a hypoechoic lesion at 10:00 within the right breast on ultrasound. She had an ultrasound-guided core biopsy performed on 06/04/2012. 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 Tumor was ER +100% PR +100% with a Ki-67 15% and HER-2/neu negative. On 06/10/2012 patient had MRI of the breasts performed that showed a 1.4 cm mass within the upper-outer quadrant of the right breast in the middle third without other areas. 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 We discussed the side effects risks and benefits of this treatment.   CURRENT THERAPY:tamoxifen 20 mg daily starting a day 2014  INTERVAL HISTORY: Kathleen Harris 67 y.o. female returns forfollowup visit  Overall she tolerated it well without any complaints. She denies any  nausea vomiting fevers chills night sweats headaches shortness of breath chest pains palpitations no myalgias and arthralgias remainder of the 10 point review of systems is negative.  MEDICAL HISTORY: Past Medical History  Diagnosis Date  . Wears glasses   . Medical history non-contributory   . Allergy   . Arthritis     fingers  . Asthma     as a child last attack teenager  . Irregular heart beat   . Hot flashes   . Hx of radiation therapy 08/05/12-08/26/12    right breast 4256 cGy 16 sessions    ALLERGIES:  is allergic to latex; penicillins; and sulfonamide derivatives.  MEDICATIONS:  Current Outpatient Prescriptions  Medication Sig Dispense Refill  . aspirin 81 MG tablet Take 81 mg by mouth daily.      Marland Kitchen b complex vitamins tablet Take 1 tablet by mouth daily.      . calcium & magnesium carbonates (MYLANTA) 311-232 MG per tablet Take 1 tablet by mouth daily.      . cholecalciferol (VITAMIN D) 1000 UNITS tablet Take 1,000 Units by mouth daily. d3      . hyaluronate sodium (RADIAPLEXRX) GEL Apply topically 2 (two) times daily.      Marland Kitchen ibuprofen (ADVIL,MOTRIN) 200 MG tablet Take 200 mg by mouth every 6 (six) hours as needed for pain.      . Multiple Vitamins-Minerals (MULTIVITAMIN WITH MINERALS) tablet Take 1 tablet by mouth daily.      . non-metallic deodorant Thornton Papas) MISC Apply 1 application topically daily as needed.      Marland Kitchen OVER THE COUNTER  MEDICATION Take 2 capsules by mouth 2 (two) times daily. Green Miracle: 2 capsules in am, 3rd capsule 2pm daily      . OVER THE COUNTER MEDICATION Take by mouth 2 (two) times daily. Arctic Cod Liver - Omega 3      . PHYTOSTEROLS PO Take 2 capsules by mouth 2 (two) times daily. 1 in am 1 in afternoon      . Probiotic Product (PROBIOTIC DAILY PO) Take by mouth daily.      . tamoxifen (NOLVADEX) 20 MG tablet Take 1 tablet (20 mg total) by mouth daily.  30 tablet  6  . vitamin C (ASCORBIC ACID) 500 MG tablet Take 500 mg by mouth daily.       No  current facility-administered medications for this visit.    SURGICAL HISTORY:  Past Surgical History  Procedure Laterality Date  . Tonsillectomy and adenoidectomy    . Appendectomy    . Breast surgery  2010    Several lumectomies.lt  . Colonoscopy    . Breast lumpectomy with needle localization and axillary sentinel lymph node bx Right 06/27/2012    Procedure: BREAST LUMPECTOMY WITH NEEDLE LOCALIZATION AND AXILLARY SENTINEL LYMPH NODE BX remove skin lesion right breast ;  Surgeon: Kathleen Pu. Cornett, MD;  Location: [facility];  Service: General;  Laterality: Right;  11:00 needle localization at BCG  1:00 nuclear medicine injection   . Abdominal hysterectomy  1998    b/l oopherectomt    REVIEW OF SYSTEMS:  Pertinent items are noted in HPI.   HEALTH MAINTENANCE:  PHYSICAL EXAMINATION: Blood pressure 119/76, pulse 98, temperature 98.3 F (36.8 C), temperature source Oral, resp. rate 18, height 5\' 3"  (1.6 m), weight 145 lb 4.8 oz (65.908 kg). Body mass index is 25.75 kg/(m^2). ECOG PERFORMANCE STATUS: 0 - Asymptomatic   General appearance: alert, cooperative and appears stated age Resp: clear to auscultation bilaterally and normal percussion bilaterally Cardio: regular rate and rhythm GI: soft, non-tender; bowel sounds normal; no masses,  no organomegaly Extremities: extremities normal, atraumatic, no cyanosis or edema Neurologic: Grossly normal   LABORATORY DATA: Lab Results  Component Value Date   WBC 6.6 12/23/2012   HGB 14.2 12/23/2012   HCT 41.9 12/23/2012   MCV 93.9 12/23/2012   PLT 237 12/23/2012      Chemistry      Component Value Date/Time   NA 140 09/06/2012 1552   NA 140 02/12/2009 1525   K 4.2 09/06/2012 1552   K 4.6 02/12/2009 1525   CL 98 02/12/2009 1525   CO2 28 09/06/2012 1552   CO2 30 02/12/2009 1525   BUN 19.3 09/06/2012 1552   BUN 12 02/12/2009 1525   CREATININE 0.9 09/06/2012 1552   CREATININE 0.74 02/12/2009 1525      Component Value  Date/Time   CALCIUM 10.8* 09/06/2012 1552   CALCIUM 10.7* 02/12/2009 1525   ALKPHOS 76 09/06/2012 1552   ALKPHOS 58 02/16/2006 1157   AST 25 09/06/2012 1552   AST 25 02/16/2006 1157   ALT 37 09/06/2012 1552   ALT 29 02/16/2006 1157   BILITOT 0.44 09/06/2012 1552   BILITOT 0.8 02/16/2006 1157       RADIOGRAPHIC STUDIES:  No results found.  ASSESSMENT: 67 year old female with  #1 stage I invasive ductal carcinoma of the left breast diagnosed Jun 27 2012. Patient is status post lumpectomy followed by radiation therapy. Overall she tolerated the radiation well without any significant problems. Patient's tumor was ER positive. There  for she will now begin adjuvant antiestrogen therapy consisting of tamoxifen 20 mg daily. We discussed the risks benefits and side effects of it. Total of 10 years of therapy is planned.   PLAN:   #1 continue with tamoxifen 20 mg daily   #2 I will see you back in 6 months time for followup.   All questions were answered. The patient knows to call the clinic with any problems, questions or concerns. We can certainly see the patient much sooner if necessary.  I spent 15 minutes counseling the patient face to face. The total time spent in the appointment was 20 minutes.    Drue Second, MD Medical/Oncology [facility] [phone] (beeper) [phone] (Office)  12/23/2012, 11:48 AM

## 2013-03-28 ENCOUNTER — Other Ambulatory Visit: Payer: Self-pay | Admitting: Oncology

## 2013-03-28 DIAGNOSIS — C50412 Malignant neoplasm of upper-outer quadrant of left female breast: Secondary | ICD-10-CM

## 2013-04-04 ENCOUNTER — Other Ambulatory Visit: Payer: Self-pay | Admitting: Internal Medicine

## 2013-04-04 DIAGNOSIS — Z853 Personal history of malignant neoplasm of breast: Secondary | ICD-10-CM

## 2013-05-23 ENCOUNTER — Encounter (INDEPENDENT_AMBULATORY_CARE_PROVIDER_SITE_OTHER): Payer: Self-pay

## 2013-05-23 ENCOUNTER — Ambulatory Visit
Admission: RE | Admit: 2013-05-23 | Discharge: 2013-05-23 | Disposition: A | Discharge status: Home / Self Care | Payer: BC Managed Care – PPO | Source: Ambulatory Visit | Attending: Internal Medicine | Admitting: Internal Medicine

## 2013-05-23 DIAGNOSIS — Z853 Personal history of malignant neoplasm of breast: Secondary | ICD-10-CM

## 2013-06-17 ENCOUNTER — Telehealth: Payer: Self-pay | Admitting: Physician Assistant

## 2013-06-17 NOTE — Telephone Encounter (Signed)
, °

## 2013-07-03 ENCOUNTER — Other Ambulatory Visit: Payer: BC Managed Care – PPO

## 2013-07-03 ENCOUNTER — Ambulatory Visit: Payer: BC Managed Care – PPO | Admitting: Oncology

## 2013-07-09 ENCOUNTER — Encounter: Payer: Self-pay | Admitting: Physician Assistant

## 2013-07-09 ENCOUNTER — Ambulatory Visit (HOSPITAL_BASED_OUTPATIENT_CLINIC_OR_DEPARTMENT_OTHER): Payer: BC Managed Care – PPO | Admitting: Physician Assistant

## 2013-07-09 ENCOUNTER — Other Ambulatory Visit (HOSPITAL_BASED_OUTPATIENT_CLINIC_OR_DEPARTMENT_OTHER): Payer: BC Managed Care – PPO

## 2013-07-09 VITALS — BP 124/61 | HR 90 | Temp 98.2°F | Resp 18 | Ht 63.0 in | Wt 149.1 lb

## 2013-07-09 DIAGNOSIS — Z79810 Long term (current) use of selective estrogen receptor modulators (SERMs): Secondary | ICD-10-CM

## 2013-07-09 DIAGNOSIS — J3489 Other specified disorders of nose and nasal sinuses: Secondary | ICD-10-CM

## 2013-07-09 DIAGNOSIS — C50419 Malignant neoplasm of upper-outer quadrant of unspecified female breast: Secondary | ICD-10-CM

## 2013-07-09 DIAGNOSIS — Z170 Estrogen receptor positive status [ER+]: Secondary | ICD-10-CM

## 2013-07-09 DIAGNOSIS — C50412 Malignant neoplasm of upper-outer quadrant of left female breast: Secondary | ICD-10-CM

## 2013-07-09 LAB — CBC WITH DIFFERENTIAL/PLATELET
BASO%: 0.4 % (ref 0.0–2.0)
Basophils Absolute: 0 10*3/uL (ref 0.0–0.1)
EOS%: 2 % (ref 0.0–7.0)
Eosinophils Absolute: 0.2 10*3/uL (ref 0.0–0.5)
HCT: 40.9 % (ref 34.8–46.6)
HGB: 13.8 g/dL (ref 11.6–15.9)
LYMPH#: 2.1 10*3/uL (ref 0.9–3.3)
LYMPH%: 27.2 % (ref 14.0–49.7)
MCH: 31.8 pg (ref 25.1–34.0)
MCHC: 33.8 g/dL (ref 31.5–36.0)
MCV: 94 fL (ref 79.5–101.0)
MONO#: 0.7 10*3/uL (ref 0.1–0.9)
MONO%: 8.5 % (ref 0.0–14.0)
NEUT#: 4.8 10*3/uL (ref 1.5–6.5)
NEUT%: 61.9 % (ref 38.4–76.8)
Platelets: 234 10*3/uL (ref 145–400)
RBC: 4.35 10*6/uL (ref 3.70–5.45)
RDW: 12.9 % (ref 11.2–14.5)
WBC: 7.8 10*3/uL (ref 3.9–10.3)

## 2013-07-09 LAB — COMPREHENSIVE METABOLIC PANEL (CC13)
ALT: 22 U/L (ref 0–55)
AST: 19 U/L (ref 5–34)
Albumin: 3.7 g/dL (ref 3.5–5.0)
Alkaline Phosphatase: 61 U/L (ref 40–150)
Anion Gap: 8 mEq/L (ref 3–11)
BUN: 12.5 mg/dL (ref 7.0–26.0)
CALCIUM: 10 mg/dL (ref 8.4–10.4)
CHLORIDE: 103 meq/L (ref 98–109)
CO2: 29 mEq/L (ref 22–29)
CREATININE: 0.8 mg/dL (ref 0.6–1.1)
Glucose: 150 mg/dl — ABNORMAL HIGH (ref 70–140)
POTASSIUM: 4.5 meq/L (ref 3.5–5.1)
Sodium: 140 mEq/L (ref 136–145)
Total Bilirubin: 0.48 mg/dL (ref 0.20–1.20)
Total Protein: 6.2 g/dL — ABNORMAL LOW (ref 6.4–8.3)

## 2013-07-09 MED ORDER — TAMOXIFEN CITRATE 20 MG PO TABS: ORAL_TABLET | ORAL | Status: DC

## 2013-07-09 NOTE — Patient Instructions (Signed)
Continue tamoxifen as prescribed Follow up in 6 months

## 2013-07-09 NOTE — Progress Notes (Signed)
OFFICE PROGRESS NOTE  CC**  Kathleen Cobble, MD [address] [place] [address] Dr. Erroll Luna  Dr. Dory Horn  Dr. Arloa Koh  DIAGNOSIS: 68 year old female with new diagnosis of stage I right breast cancer diagnosis on 06/27/12   STAGE:  Breast cancer of upper-outer quadrant of left female breast  Primary site: Breast (Right)  Staging method: AJCC 7th Edition  Clinical: Stage IA (T1b, N0, cM0)  Pathologic: Stage IA (T1b, N0(i-), cM0) signed by Deatra Robinson, MD on 08/11/2012 1:41 PM  Summary: Stage IA (T1b, N0(i-), cM0)   PRIOR THERAPY: #1 Patient had a screening mammogram performed in April to thousand and 14 at the breast Center. She was found to have a possible architectural distortion within the right breast. 06/04/2012 that showed persistent architectural distortion with a 0.6 cm a hypoechoic lesion at 10:00 within the right breast on ultrasound. She had an ultrasound-guided core biopsy performed on 06/04/2012. 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 Tumor was ER +100% PR +100% with a Ki-67 15% and HER-2/neu negative. On 06/10/2012 patient had MRI of the breasts performed that showed a 1.4 cm mass within the upper-outer quadrant of the right breast in the middle third without other areas. 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 We discussed the side effects risks and benefits of this treatment.   CURRENT THERAPY:tamoxifen 20 mg daily starting a day 2014  INTERVAL HISTORY: Kathleen Harris 68 y.o. female returns for a followup visit.  Overall she tolerated it well without any complaints. She's had some nasal congestion, cough  over the past several days. She's been using Robitussin CF with good results. Her symptoms may be associated with the recent rise in pollen. She had her diagnostic digital mammogram plus bilateral tomography on 06/17/2013 both revealing no evidence of malignancy. She denies any nausea vomiting fevers chills night sweats headaches shortness of breath chest pains palpitations no myalgias and arthralgias remainder of the 10 point review of systems is negative.  MEDICAL HISTORY: Past Medical History  Diagnosis Date  . Wears glasses   . Medical history non-contributory   . Allergy   . Arthritis     fingers  . Asthma     as a child last attack teenager  . Irregular heart beat   . Hot flashes   . Hx of radiation therapy 08/05/12-08/26/12    right breast 4256 cGy 16 sessions    ALLERGIES:  is allergic to latex; penicillins; and sulfonamide derivatives.  MEDICATIONS:  Current Outpatient Prescriptions  Medication Sig Dispense Refill  . aspirin 81 MG tablet Take 81 mg by mouth daily.      Marland Kitchen b complex vitamins tablet Take 1 tablet by mouth daily.      . calcium & magnesium carbonates (MYLANTA) 311-232 MG per tablet Take 1 tablet by mouth daily.      . cholecalciferol (VITAMIN D) 1000 UNITS tablet Take 1,000 Units by mouth daily. d3      . Multiple Vitamins-Minerals (MULTIVITAMIN WITH MINERALS) tablet Take 1 tablet by mouth daily.      . non-metallic deodorant [name]) MISC Apply 1 application topically daily as needed.      Marland Kitchen  OVER THE COUNTER MEDICATION Take 2 capsules by mouth 2 (two) times daily. Green Miracle: 2 capsules in am, 3rd capsule 2pm daily      . OVER THE COUNTER MEDICATION Take by mouth 2 (two) times daily. Arctic Cod Liver - Omega 3      . PHYTOSTEROLS PO Take 2 capsules by mouth 2 (two) times daily. 1 in am 1 in afternoon      . Probiotic Product (PROBIOTIC DAILY PO) Take by mouth daily.      . tamoxifen (NOLVADEX) 20 MG tablet TAKE ONE TABLET BY MOUTH ONCE DAILY  30 tablet  4  .  vitamin C (ASCORBIC ACID) 500 MG tablet Take 500 mg by mouth daily.      . hyaluronate sodium (RADIAPLEXRX) GEL Apply topically 2 (two) times daily.       No current facility-administered medications for this visit.    SURGICAL HISTORY:  Past Surgical History  Procedure Laterality Date  . Tonsillectomy and adenoidectomy    . Appendectomy    . Breast surgery  2010    Several lumectomies.lt  . Colonoscopy    . Breast lumpectomy with needle localization and axillary sentinel lymph node bx Right 06/27/2012    Procedure: BREAST LUMPECTOMY WITH NEEDLE LOCALIZATION AND AXILLARY SENTINEL LYMPH NODE BX remove skin lesion right breast ;  Surgeon: Joyice Faster. Cornett, MD;  Location: [facility];  Service: General;  Laterality: Right;  11:00 needle localization at BCG  1:00 nuclear medicine injection   . Abdominal hysterectomy  1998    b/l oopherectomt    REVIEW OF SYSTEMS:  Pertinent items are noted in HPI.   HEALTH MAINTENANCE:  PHYSICAL EXAMINATION: Blood pressure 124/61, pulse 90, temperature 98.2 F (36.8 C), temperature source Oral, resp. rate 18, height _0  (1.6 m), weight 149 lb 1.6 oz (67.631 kg), SpO2 99.00%. Body mass index is 26.42 kg/(m^2).  ECOG PERFORMANCE STATUS: 0 - Asymptomatic   General appearance: alert, cooperative, appears stated age and no distress Head: Normocephalic, without obvious abnormality, atraumatic Neck: no adenopathy, no carotid bruit, no JVD, supple, symmetrical, trachea midline and thyroid not enlarged, symmetric, no tenderness/mass/nodules Lymph nodes: Cervical, supraclavicular, and axillary nodes normal. Resp: clear to auscultation bilaterally and normal percussion bilaterally Back: symmetric, no curvature. ROM normal. No CVA tenderness. Cardio: regular rate and rhythm, S1, S2 normal, no murmur, click, rub or gallop GI: soft, non-tender; bowel sounds normal; no masses,  no organomegaly Extremities: extremities normal, atraumatic, no  cyanosis or edema Neurologic: Grossly normal Breast exam: Well-healed incisions in the right breast without palpable masses, skin changes or nipple discharge, left breast is also without masses, skin changes or nipple discharge.  LABORATORY DATA: Lab Results  Component Value Date   WBC 7.8 07/09/2013   HGB 13.8 07/09/2013   HCT 40.9 07/09/2013   MCV 94.0 07/09/2013   PLT 234 07/09/2013      Chemistry      Component Value Date/Time   NA 140 07/09/2013 0847   NA 140 02/12/2009 1525   K 4.5 07/09/2013 0847   K 4.6 02/12/2009 1525   CL 98 02/12/2009 1525   CO2 29 07/09/2013 0847   CO2 30 02/12/2009 1525   BUN 12.5 07/09/2013 0847   BUN 12 02/12/2009 1525   CREATININE 0.8 07/09/2013 0847   CREATININE 0.74 02/12/2009 1525      Component Value Date/Time   CALCIUM 10.0 07/09/2013 0847   CALCIUM 10.7* 02/12/2009 1525   ALKPHOS 61 07/09/2013 0847  ALKPHOS 58 02/16/2006 1157   AST 19 07/09/2013 0847   AST 25 02/16/2006 1157   ALT 22 07/09/2013 0847   ALT 29 02/16/2006 1157   BILITOT 0.48 07/09/2013 0847   BILITOT 0.8 02/16/2006 1157       RADIOGRAPHIC STUDIES:  No results found.  ASSESSMENT: 68 year old female with  #1 stage I invasive ductal carcinoma of the left breast diagnosed Jun 27 2012. Patient is status post lumpectomy followed by radiation therapy. Overall she tolerated the radiation well without any significant problems. Patient's tumor was ER positive. She will now continue adjuvant antiestrogen therapy consisting of tamoxifen 20 mg daily. We discussed the risks benefits and side effects of it. Total of 10 years of therapy is planned.   PLAN:   1. continue with tamoxifen 20 mg daily   2. She will follow up in 6 months with Dr. Lindi Adie. 3. If her upper respiratory congestion persists or worsens, she is to follow up with her primary care provider.    All questions were answered. The patient knows to call the clinic with any problems, questions or concerns. We can certainly see the  patient much sooner if necessary.  I spent 20 minutes counseling the patient face to face. The total time spent in the appointment was 30 minutes.   [facility] [place] [phone] (Office)  07/09/2013, 4:47 PM

## 2013-07-12 ENCOUNTER — Telehealth: Payer: Self-pay | Admitting: Physician Assistant

## 2013-07-12 NOTE — Telephone Encounter (Signed)
, °

## 2013-07-18 ENCOUNTER — Telehealth: Payer: Self-pay

## 2013-07-18 NOTE — Telephone Encounter (Signed)
Pt concerned about blood sugar.  Recommended she see PCP and request A1c

## 2013-07-18 NOTE — Telephone Encounter (Signed)
Returned pt call - pt requesting results of labwork 6/10 -specifically blood sugar.  Gave pt result of 150.  Advised pt to see PCP and request A1c if she is worried about her blood sugar status.  Pt voiced understanding.

## 2013-09-05 ENCOUNTER — Ambulatory Visit (INDEPENDENT_AMBULATORY_CARE_PROVIDER_SITE_OTHER): Payer: BC Managed Care – PPO | Admitting: Internal Medicine

## 2013-09-05 ENCOUNTER — Telehealth: Payer: Self-pay | Admitting: Internal Medicine

## 2013-09-05 ENCOUNTER — Encounter: Payer: Self-pay | Admitting: Internal Medicine

## 2013-09-05 VITALS — BP 123/70 | HR 102 | Temp 98.5°F | Wt 151.0 lb

## 2013-09-05 DIAGNOSIS — C50412 Malignant neoplasm of upper-outer quadrant of left female breast: Secondary | ICD-10-CM

## 2013-09-05 DIAGNOSIS — C50419 Malignant neoplasm of upper-outer quadrant of unspecified female breast: Secondary | ICD-10-CM

## 2013-09-05 DIAGNOSIS — R232 Flushing: Secondary | ICD-10-CM

## 2013-09-05 DIAGNOSIS — N3090 Cystitis, unspecified without hematuria: Secondary | ICD-10-CM

## 2013-09-05 DIAGNOSIS — R300 Dysuria: Secondary | ICD-10-CM

## 2013-09-05 DIAGNOSIS — N951 Menopausal and female climacteric states: Secondary | ICD-10-CM

## 2013-09-05 LAB — POCT URINALYSIS DIPSTICK
BILIRUBIN UA: NEGATIVE
GLUCOSE UA: NEGATIVE
KETONES UA: NEGATIVE
Leukocytes, UA: NEGATIVE
Nitrite, UA: NEGATIVE
Protein, UA: NEGATIVE
RBC UA: NEGATIVE
SPEC GRAV UA: 1.01
UROBILINOGEN UA: 0.2
pH, UA: 5

## 2013-09-05 MED ORDER — CIPROFLOXACIN HCL 250 MG PO TABS: 250.0000 mg | ORAL_TABLET | Freq: Two times a day (BID) | ORAL | Status: DC

## 2013-09-05 MED ORDER — PHENAZOPYRIDINE HCL 100 MG PO TABS: 100.0000 mg | ORAL_TABLET | Freq: Three times a day (TID) | ORAL | Status: DC | PRN

## 2013-09-05 MED ORDER — VENLAFAXINE HCL ER 37.5 MG PO CP24: 37.5000 mg | ORAL_CAPSULE | Freq: Every day | ORAL | Status: DC

## 2013-09-05 NOTE — Progress Notes (Signed)
Pre visit review using our clinic review tool, if applicable. No additional management support is needed unless otherwise documented below in the visit note. 

## 2013-09-05 NOTE — Telephone Encounter (Signed)
Pt request transfer from [facility] to Dr. Camila Li. Please advise.

## 2013-09-05 NOTE — Assessment & Plan Note (Signed)
Chronic - worse Options discussed. Will try Effexor XR

## 2013-09-05 NOTE — Telephone Encounter (Signed)
OK ; I have not seen her since 2012. [employer]

## 2013-09-05 NOTE — Assessment & Plan Note (Signed)
Cipro x5d Pyridium prn UA

## 2013-09-05 NOTE — Progress Notes (Signed)
   Subjective:    Dysuria  This is a new problem. The current episode started in the past 7 days. The problem has been unchanged. The quality of the pain is described as burning. The pain is severe. There has been no fever. She is not sexually active. There is no history of pyelonephritis. Associated symptoms include frequency and urgency. Pertinent negatives include no chills, discharge or nausea. She has tried nothing for the symptoms. There is no history of catheterization.   C/o hot flashes x long time - severe    Review of Systems  Constitutional: Positive for diaphoresis. Negative for chills, activity change, appetite change, fatigue and unexpected weight change.  HENT: Negative for congestion, mouth sores and sinus pressure.   Eyes: Negative for visual disturbance.  Respiratory: Negative for cough and chest tightness.   Gastrointestinal: Negative for nausea and abdominal pain.  Genitourinary: Positive for dysuria, urgency, frequency and decreased urine volume. Negative for difficulty urinating and vaginal pain.  Musculoskeletal: Negative for back pain and gait problem.  Skin: Negative for pallor and rash.  Neurological: Negative for dizziness, tremors, weakness, numbness and headaches.  Psychiatric/Behavioral: Negative for suicidal ideas, confusion and sleep disturbance.       Objective:   Physical Exam  Constitutional: She appears well-developed. No distress.  HENT:  Head: Normocephalic.  Right Ear: External ear normal.  Left Ear: External ear normal.  Nose: Nose normal.  Mouth/Throat: Oropharynx is clear and moist.  Eyes: Conjunctivae are normal. Pupils are equal, round, and reactive to light. Right eye exhibits no discharge. Left eye exhibits no discharge.  Neck: Normal range of motion. Neck supple. No JVD present. No tracheal deviation present. No thyromegaly present.  Cardiovascular: Normal rate, regular rhythm and normal heart sounds.   Pulmonary/Chest: No stridor.  No respiratory distress. She has no wheezes.  Abdominal: Soft. Bowel sounds are normal. She exhibits no distension and no mass. There is no tenderness. There is no rebound and no guarding.  Musculoskeletal: She exhibits no edema and no tenderness.  Lymphadenopathy:    She has no cervical adenopathy.  Neurological: She displays normal reflexes. No cranial nerve deficit. She exhibits normal muscle tone. Coordination normal.  Skin: No rash noted. No erythema.  Psychiatric: She has a normal mood and affect. Her behavior is normal. Judgment and thought content normal.    Lab Results  Component Value Date   WBC 7.8 07/09/2013   HGB 13.8 07/09/2013   HCT 40.9 07/09/2013   PLT 234 07/09/2013   GLUCOSE 150* 07/09/2013   CHOL 213* 02/16/2006   TRIG 181* 02/16/2006   HDL 47.6 02/16/2006   LDLDIRECT 139.4 02/16/2006   ALT 22 07/09/2013   AST 19 07/09/2013   NA 140 07/09/2013   K 4.5 07/09/2013   CL 98 02/12/2009   CREATININE 0.8 07/09/2013   BUN 12.5 07/09/2013   CO2 29 07/09/2013   TSH 1.42 03/18/2009   HGBA1C 5.6 02/16/2006         Assessment & Plan:

## 2013-09-08 NOTE — Telephone Encounter (Signed)
Try to call and inform pt of bother doctor response but can not leave vm. Will continue to try.

## 2013-09-08 NOTE — Telephone Encounter (Signed)
Ok Thx 

## 2013-09-10 ENCOUNTER — Telehealth: Payer: Self-pay | Admitting: Hematology and Oncology

## 2013-09-10 NOTE — Telephone Encounter (Signed)
Pt cld to r/s labs/ov due to commencement at school, mailed out updated schedule.Marland Kitchen..KJ

## 2013-09-10 NOTE — Assessment & Plan Note (Signed)
Reviewed

## 2013-11-06 ENCOUNTER — Telehealth: Payer: Self-pay | Admitting: Hematology and Oncology

## 2013-11-06 NOTE — Telephone Encounter (Signed)
S/w pt to advise appt chg on 12/31 due to md pal. Pt req appt on 12/28. Gave appt 12/28 @ 9.30am. and mailed revised appt calendar.

## 2014-01-08 ENCOUNTER — Other Ambulatory Visit: Payer: BC Managed Care – PPO

## 2014-01-08 ENCOUNTER — Ambulatory Visit: Payer: BC Managed Care – PPO | Admitting: Hematology and Oncology

## 2014-01-09 IMAGING — US US BREAST*R*
1 series · 5 of 5 positions shown · non-contrast
Comparison: [DATE] [DATE], [DATE], [DATE] [DATE], [DATE], [DATE] [DATE], [DATE]

CLINICAL DATA: Called back from screening mammogram for possible
architectural distortion right breast

DIGITAL DIAGNOSTIC RIGHT MAMMOGRAM June 04, 2012

[Series 1: us breast*right* · 5 of 5 slices shown]
[im 1/5]
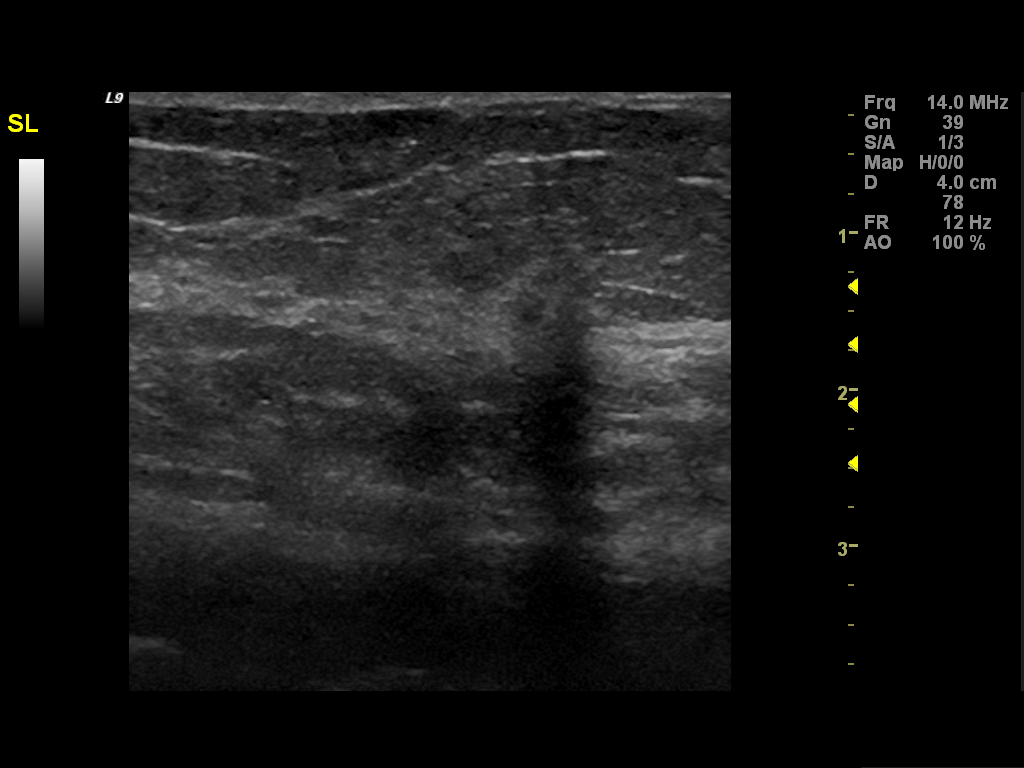
[im 2/5]
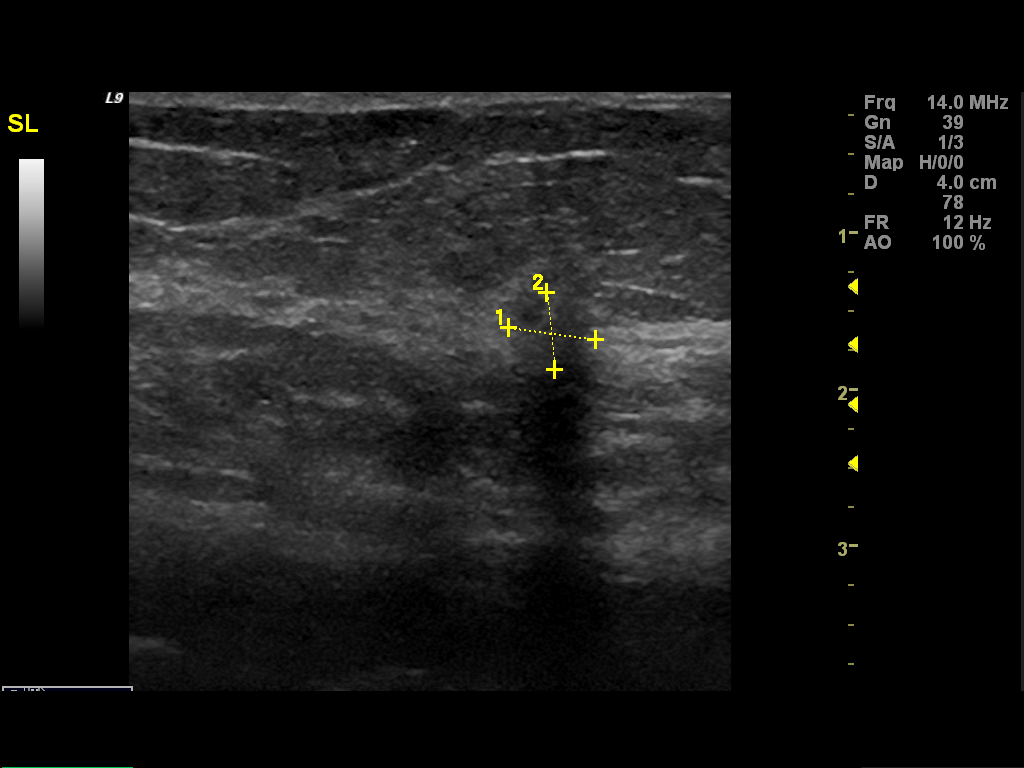
[im 3/5]
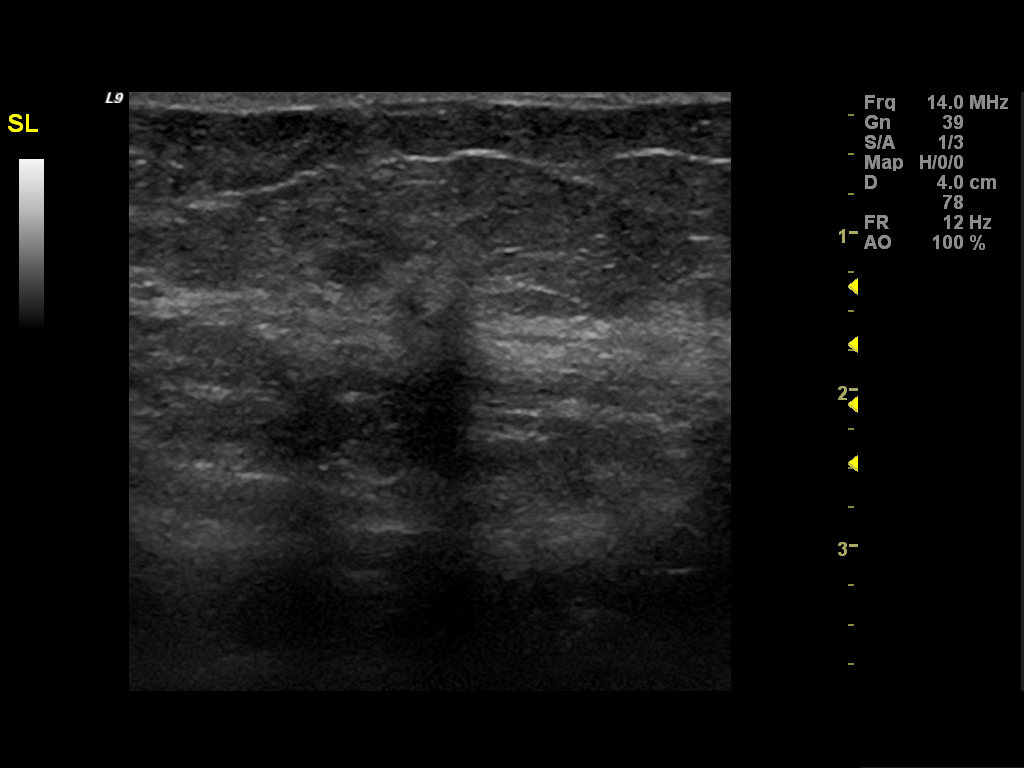
[im 4/5]
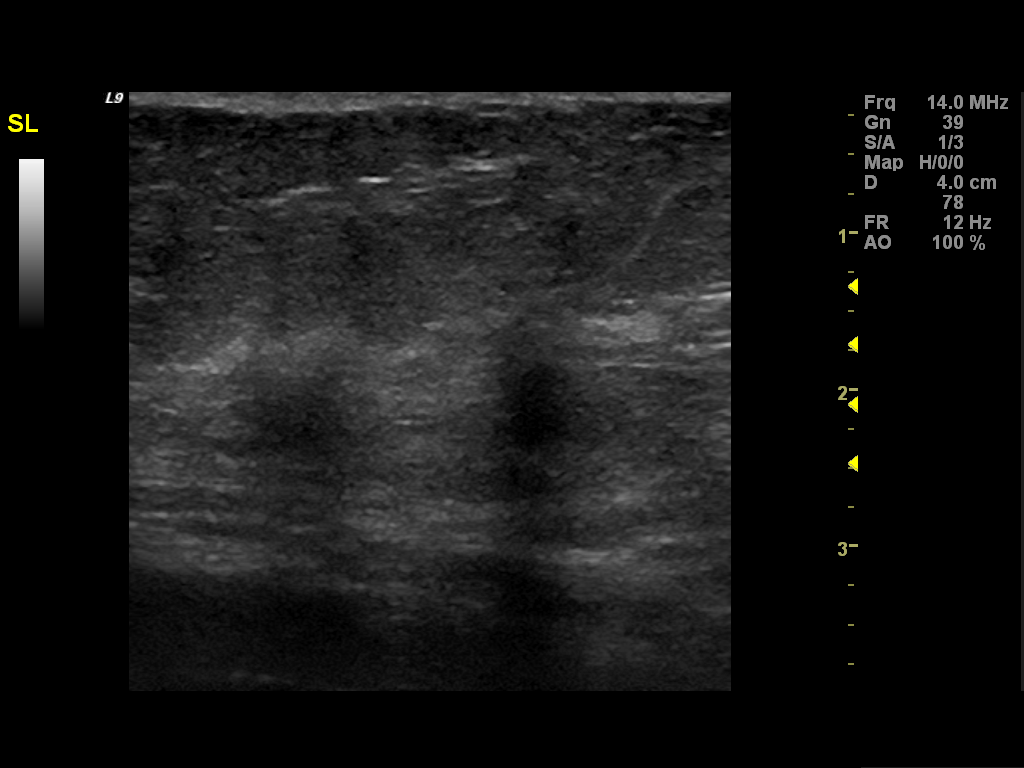
[im 5/5]
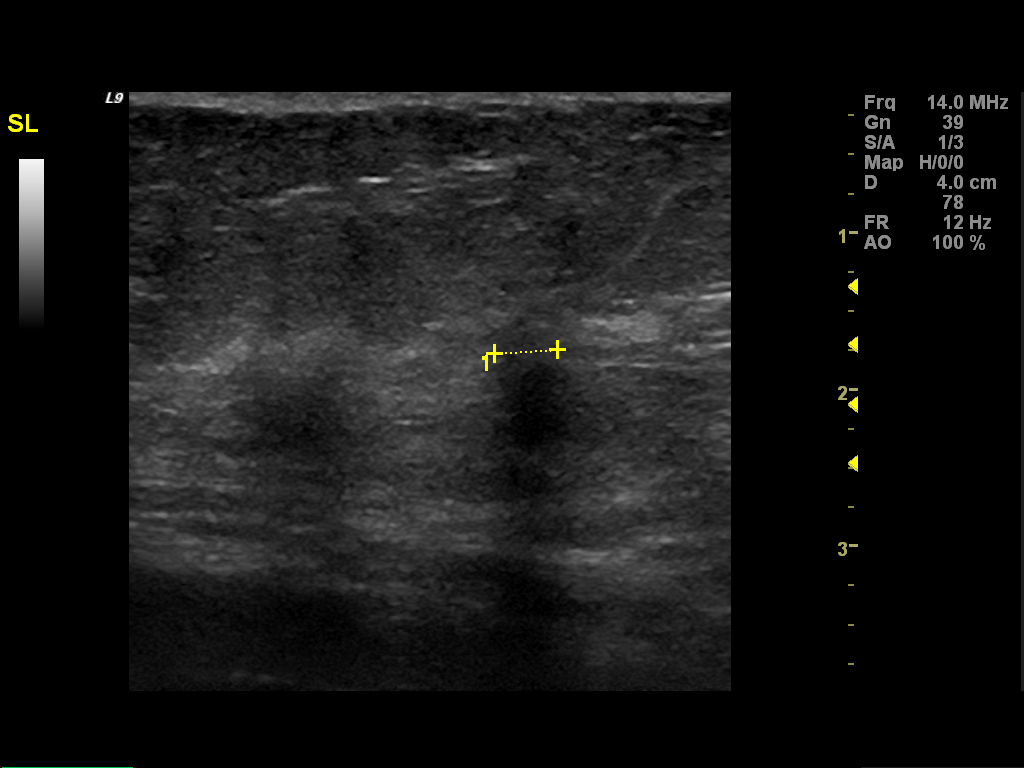

[5 of 5 positions shown; findings below may reference images not displayed]

FINDINGS: ACR Breast Density Category heterogeneously dense

Spot compression right CC view, spot compression right MLO views
are submitted.  Previously questioned architectural distortion
persists.

Ultrasound:  At the right breast 10 o'clock 3 cm from the nipple,
there is a 0.56 cm hypoechoic lesion with posterior shadowing.  A
BB was placed on the skin under ultrasound.  Spot tangential and
spot compression right MLO views with the BB in place were
obtained.  The ultrasound finding correlates to the architectural
distortion on mammogram.
IMPRESSION: Suspicious findings.

RECOMMENDATION:
Ultrasound guided core biopsy right breast.

I have discussed the findings and recommendations with the patient.
Results were also provided in writing at the conclusion of the
visit.  If applicable, a reminder letter will be sent to the
patient regarding her next appointment.

BI-RADS CATEGORY 4:  Suspicious abnormality - biopsy should be
considered.

## 2014-01-13 ENCOUNTER — Encounter: Payer: BC Managed Care – PPO | Admitting: Internal Medicine

## 2014-01-26 ENCOUNTER — Other Ambulatory Visit: Payer: Self-pay

## 2014-01-26 ENCOUNTER — Ambulatory Visit (HOSPITAL_BASED_OUTPATIENT_CLINIC_OR_DEPARTMENT_OTHER): Payer: BC Managed Care – PPO | Admitting: Hematology and Oncology

## 2014-01-26 ENCOUNTER — Telehealth: Payer: Self-pay | Admitting: Hematology and Oncology

## 2014-01-26 ENCOUNTER — Other Ambulatory Visit (HOSPITAL_BASED_OUTPATIENT_CLINIC_OR_DEPARTMENT_OTHER): Payer: BC Managed Care – PPO

## 2014-01-26 VITALS — BP 123/59 | HR 78 | Temp 98.2°F | Resp 20 | Ht 63.0 in | Wt 151.3 lb

## 2014-01-26 DIAGNOSIS — C50412 Malignant neoplasm of upper-outer quadrant of left female breast: Secondary | ICD-10-CM

## 2014-01-26 DIAGNOSIS — C50411 Malignant neoplasm of upper-outer quadrant of right female breast: Secondary | ICD-10-CM

## 2014-01-26 LAB — COMPREHENSIVE METABOLIC PANEL (CC13)
ALK PHOS: 60 U/L (ref 40–150)
ALT: 29 U/L (ref 0–55)
AST: 25 U/L (ref 5–34)
Albumin: 3.8 g/dL (ref 3.5–5.0)
Anion Gap: 8 mEq/L (ref 3–11)
BILIRUBIN TOTAL: 0.52 mg/dL (ref 0.20–1.20)
BUN: 9.7 mg/dL (ref 7.0–26.0)
CO2: 26 mEq/L (ref 22–29)
Calcium: 9.3 mg/dL (ref 8.4–10.4)
Chloride: 106 mEq/L (ref 98–109)
Creatinine: 0.7 mg/dL (ref 0.6–1.1)
EGFR: 89 mL/min/{1.73_m2} — ABNORMAL LOW (ref >90)
Glucose: 188 mg/dl — ABNORMAL HIGH (ref 70–140)
Potassium: 3.9 mEq/L (ref 3.5–5.1)
SODIUM: 140 meq/L (ref 136–145)
TOTAL PROTEIN: 5.9 g/dL — AB (ref 6.4–8.3)

## 2014-01-26 LAB — CBC WITH DIFFERENTIAL/PLATELET
BASO%: 0.6 % (ref 0.0–2.0)
Basophils Absolute: 0 10*3/uL (ref 0.0–0.1)
EOS ABS: 0.1 10*3/uL (ref 0.0–0.5)
EOS%: 1.9 % (ref 0.0–7.0)
HCT: 41.6 % (ref 34.8–46.6)
HGB: 13.9 g/dL (ref 11.6–15.9)
LYMPH#: 2.1 10*3/uL (ref 0.9–3.3)
LYMPH%: 35.5 % (ref 14.0–49.7)
MCH: 31.6 pg (ref 25.1–34.0)
MCHC: 33.4 g/dL (ref 31.5–36.0)
MCV: 94.5 fL (ref 79.5–101.0)
MONO#: 0.5 10*3/uL (ref 0.1–0.9)
MONO%: 7.6 % (ref 0.0–14.0)
NEUT%: 54.4 % (ref 38.4–76.8)
NEUTROS ABS: 3.2 10*3/uL (ref 1.5–6.5)
PLATELETS: 223 10*3/uL (ref 145–400)
RBC: 4.4 10*6/uL (ref 3.70–5.45)
RDW: 12.9 % (ref 11.2–14.5)
WBC: 6 10*3/uL (ref 3.9–10.3)

## 2014-01-26 MED ORDER — TAMOXIFEN CITRATE 20 MG PO TABS: ORAL_TABLET | ORAL | Status: DC

## 2014-01-26 NOTE — Assessment & Plan Note (Addendum)
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 Breast exam done by her gynecologist and at the breast center were normal May 2015, patient refused breast exam today 2. Mammograms 06/17/2013 normal   Return to clinic in 6 months for follow-up

## 2014-01-26 NOTE — Progress Notes (Signed)
Patient Care Team: Cassandria Anger, MD as PCP - General (Internal Medicine)  DIAGNOSIS: Breast cancer of upper-outer quadrant of left female breast   Staging form: Breast, AJCC 7th Edition     Clinical: Stage IA (T1b, N0, cM0) - Unsigned     Pathologic: Stage IA (T1b, N0(i-), cM0) - Signed by Deatra Robinson, MD on 08/11/2012   SUMMARY OF ONCOLOGIC HISTORY:   Breast cancer of upper-outer quadrant of left female breast   06/04/2012 Initial Diagnosis Right breast: Invasive ductal carcinoma and DCIS with calcifications, ER 100%, PR 100%, K6 is on 15%, HER-2 negative   06/10/2012 Breast MRI Breast MRI showing 1.4 cm mass upper outer quadrant right breast   06/27/2012 Surgery Right breast partial mastectomy and SLN biopsy: 0.9 cm, grade 1, IDC with DCIS, 2 SLN negative   08/05/2012 - 08/26/2012 Radiation Therapy Adjuvant radiation therapy   09/26/2012 -  Anti-estrogen oral therapy Tamoxifen 20 mg daily    CHIEF COMPLIANT: No complaints, breast cancer  INTERVAL HISTORY: Kathleen Harris is a 68 year old lady with above-mentioned history of right-sided breast cancer treated with lumpectomy followed by radiation and is currently on tamoxifen since August 2014. She has been tolerating it extremely well without any major problems or concerns. She has hot flashes which she attributes to menopausal symptoms.  REVIEW OF SYSTEMS:   Constitutional: Denies fevers, chills or abnormal weight loss Eyes: Denies blurriness of vision Ears, nose, mouth, throat, and face: Denies mucositis or sore throat Respiratory: Denies cough, dyspnea or wheezes Cardiovascular: Denies palpitation, chest discomfort or lower extremity swelling Gastrointestinal:  Denies nausea, heartburn or change in bowel habits Skin: Denies abnormal skin rashes Lymphatics: Denies new lymphadenopathy or easy bruising Neurological:Denies numbness, tingling or new weaknesses Behavioral/Psych: Mood is stable, no new changes  Breast:  denies any  pain or lumps or nodules in either breasts All other systems were reviewed with the patient and are negative.  I have reviewed the past medical history, past surgical history, social history and family history with the patient and they are unchanged from previous note.  ALLERGIES:  is allergic to latex; penicillins; and sulfonamide derivatives.  MEDICATIONS:  Current Outpatient Prescriptions  Medication Sig Dispense Refill  . aspirin 81 MG tablet Take 81 mg by mouth daily.    Marland Kitchen b complex vitamins tablet Take 1 tablet by mouth daily.    . calcium & magnesium carbonates (MYLANTA) 311-232 MG per tablet Take 1 tablet by mouth daily.    . cholecalciferol (VITAMIN D) 1000 UNITS tablet Take 1,000 Units by mouth daily. d3    . Multiple Vitamins-Minerals (MULTIVITAMIN WITH MINERALS) tablet Take 1 tablet by mouth daily.    . non-metallic deodorant [name]) MISC Apply 1 application topically daily as needed.    Marland Kitchen OVER THE COUNTER MEDICATION Take 2 capsules by mouth 2 (two) times daily. Green Miracle: 2 capsules in am, 3rd capsule 2pm daily    . OVER THE COUNTER MEDICATION Take by mouth 2 (two) times daily. Arctic Cod Liver - Omega 3    . phenazopyridine (PYRIDIUM) 100 MG tablet Take 1 tablet (100 mg total) by mouth 3 (three) times daily as needed for pain. 30 tablet 0  . PHYTOSTEROLS PO Take 2 capsules by mouth 2 (two) times daily. 1 in am 1 in afternoon    . Probiotic Product (PROBIOTIC DAILY PO) Take by mouth daily.    . tamoxifen (NOLVADEX) 20 MG tablet TAKE ONE TABLET BY MOUTH ONCE DAILY 30 tablet 4  .  vitamin C (ASCORBIC ACID) 500 MG tablet Take 500 mg by mouth daily.     No current facility-administered medications for this visit.    PHYSICAL EXAMINATION: ECOG PERFORMANCE STATUS: 0 - Asymptomatic  Filed Vitals:   01/26/14 1005  BP: 123/59  Pulse: 78  Temp: 98.2 F (36.8 C)  Resp: 20   Filed Weights   01/26/14 1005  Weight: 151 lb 4.8 oz (68.629 kg)    GENERAL:alert, no distress  and comfortable SKIN: skin color, texture, turgor are normal, no rashes or significant lesions EYES: normal, Conjunctiva are pink and non-injected, sclera clear OROPHARYNX:no exudate, no erythema and lips, buccal mucosa, and tongue normal  NECK: supple, thyroid normal size, non-tender, without nodularity LYMPH:  no palpable lymphadenopathy in the cervical, axillary or inguinal LUNGS: clear to auscultation and percussion with normal breathing effort HEART: regular rate & rhythm and no murmurs and no lower extremity edema ABDOMEN:abdomen soft, non-tender and normal bowel sounds Musculoskeletal:no cyanosis of digits and no clubbing  NEURO: alert & oriented x 3 with fluent speech, no focal motor/sensory deficits BREAST: Patient refused breasts exam   LABORATORY DATA:  I have reviewed the data as listed   Chemistry      Component Value Date/Time   NA 140 01/26/2014 0921   NA 140 02/12/2009 1525   K 3.9 01/26/2014 0921   K 4.6 02/12/2009 1525   CL 98 02/12/2009 1525   CO2 26 01/26/2014 0921   CO2 30 02/12/2009 1525   BUN 9.7 01/26/2014 0921   BUN 12 02/12/2009 1525   CREATININE 0.7 01/26/2014 0921   CREATININE 0.74 02/12/2009 1525      Component Value Date/Time   CALCIUM 9.3 01/26/2014 0921   CALCIUM 10.7* 02/12/2009 1525   ALKPHOS 60 01/26/2014 0921   ALKPHOS 58 02/16/2006 1157   AST 25 01/26/2014 0921   AST 25 02/16/2006 1157   ALT 29 01/26/2014 0921   ALT 29 02/16/2006 1157   BILITOT 0.52 01/26/2014 0921   BILITOT 0.8 02/16/2006 1157       Lab Results  Component Value Date   WBC 6.0 01/26/2014   HGB 13.9 01/26/2014   HCT 41.6 01/26/2014   MCV 94.5 01/26/2014   PLT 223 01/26/2014   NEUTROABS 3.2 01/26/2014     RADIOGRAPHIC STUDIES: I have personally reviewed the radiology reports and agreed with their findings. Mammogram done May 2015 normal  ASSESSMENT & PLAN:  Breast cancer of upper-outer quadrant of left female breast Invasive ductal carcinoma stage I  diagnosed May 2014 status post lumpectomy and radiation currently on tamoxifen 20 mg daily from August 2014  Tamoxifen toxicities: None other than hot flashes which she attributes to menopausal symptoms  Breast cancer surveillance: 1. Breast exam done by her gynecologist and at the breast center were normal May 2015, patient refused breast exam today 2. Mammograms 06/17/2013 normal   Return to clinic in 6 months for follow-up   Orders Placed This Encounter  Procedures  . CBC with Differential    Standing Status: Future     Number of Occurrences:      Standing Expiration Date: 01/26/2015  . Comprehensive metabolic panel (Cmet) - [facility]    Standing Status: Future     Number of Occurrences:      Standing Expiration Date: 01/26/2015   The patient has a good understanding of the overall plan. she agrees with it. She will call with any problems that may develop before her next visit here.   Lindi Adie,  Tyler Deis, MD 01/26/2014 10:45 AM

## 2014-01-26 NOTE — Telephone Encounter (Signed)
per pof to sch pt appt-gave pt copy of sch °

## 2014-01-29 ENCOUNTER — Ambulatory Visit: Payer: BC Managed Care – PPO | Admitting: Hematology and Oncology

## 2014-01-29 ENCOUNTER — Other Ambulatory Visit: Payer: BC Managed Care – PPO

## 2014-04-02 ENCOUNTER — Other Ambulatory Visit: Payer: Self-pay | Admitting: Surgery

## 2014-04-02 ENCOUNTER — Other Ambulatory Visit: Payer: Self-pay

## 2014-04-02 DIAGNOSIS — Z9889 Other specified postprocedural states: Secondary | ICD-10-CM

## 2014-05-22 ENCOUNTER — Ambulatory Visit
Admission: RE | Admit: 2014-05-22 | Discharge: 2014-05-22 | Disposition: A | Discharge status: Home / Self Care | Payer: BC Managed Care – PPO | Source: Ambulatory Visit | Attending: Surgery | Admitting: Surgery

## 2014-05-22 DIAGNOSIS — Z9889 Other specified postprocedural states: Secondary | ICD-10-CM

## 2014-08-17 ENCOUNTER — Other Ambulatory Visit: Payer: Self-pay | Admitting: Hematology and Oncology

## 2014-08-17 NOTE — Telephone Encounter (Signed)
Last ov 01/29/14.  Next ov 01/26/15.  Chart reviewed.

## 2014-08-18 ENCOUNTER — Other Ambulatory Visit: Payer: Self-pay | Admitting: Hematology and Oncology

## 2014-08-18 DIAGNOSIS — C50412 Malignant neoplasm of upper-outer quadrant of left female breast: Secondary | ICD-10-CM

## 2014-12-21 ENCOUNTER — Encounter: Payer: Self-pay | Admitting: Internal Medicine

## 2014-12-21 ENCOUNTER — Other Ambulatory Visit (INDEPENDENT_AMBULATORY_CARE_PROVIDER_SITE_OTHER): Payer: BC Managed Care – PPO

## 2014-12-21 ENCOUNTER — Ambulatory Visit (INDEPENDENT_AMBULATORY_CARE_PROVIDER_SITE_OTHER): Payer: BC Managed Care – PPO | Admitting: Internal Medicine

## 2014-12-21 VITALS — BP 118/60 | HR 76 | Ht 63.0 in | Wt 148.0 lb

## 2014-12-21 DIAGNOSIS — Z0000 Encounter for general adult medical examination without abnormal findings: Secondary | ICD-10-CM

## 2014-12-21 DIAGNOSIS — R7309 Other abnormal glucose: Secondary | ICD-10-CM

## 2014-12-21 LAB — HEPATIC FUNCTION PANEL
ALBUMIN: 4.4 g/dL (ref 3.5–5.2)
ALK PHOS: 62 U/L (ref 39–117)
ALT: 36 U/L — ABNORMAL HIGH (ref 0–35)
AST: 36 U/L (ref 0–37)
Bilirubin, Direct: 0.1 mg/dL (ref 0.0–0.3)
TOTAL PROTEIN: 6.5 g/dL (ref 6.0–8.3)
Total Bilirubin: 0.5 mg/dL (ref 0.2–1.2)

## 2014-12-21 LAB — TSH: TSH: 1.59 u[IU]/mL (ref 0.35–4.50)

## 2014-12-21 LAB — URINALYSIS
BILIRUBIN URINE: NEGATIVE
Hgb urine dipstick: NEGATIVE
KETONES UR: NEGATIVE
Leukocytes, UA: NEGATIVE
Nitrite: NEGATIVE
Total Protein, Urine: NEGATIVE
URINE GLUCOSE: 100 — AB
UROBILINOGEN UA: 0.2 (ref 0.0–1.0)
pH: 6 (ref 5.0–8.0)

## 2014-12-21 LAB — LIPID PANEL
CHOL/HDL RATIO: 4
CHOLESTEROL: 176 mg/dL (ref 0–200)
HDL: 48 mg/dL (ref >39.00)
LDL CALC: 100 mg/dL — AB (ref 0–99)
NonHDL: 127.87
Triglycerides: 139 mg/dL (ref 0.0–149.0)
VLDL: 27.8 mg/dL (ref 0.0–40.0)

## 2014-12-21 LAB — BASIC METABOLIC PANEL
BUN: 11 mg/dL (ref 6–23)
CHLORIDE: 101 meq/L (ref 96–112)
CO2: 25 mEq/L (ref 19–32)
Calcium: 10 mg/dL (ref 8.4–10.5)
Creatinine, Ser: 0.59 mg/dL (ref 0.40–1.20)
GFR: 107.31 mL/min (ref >60.00)
GLUCOSE: 231 mg/dL — AB (ref 70–99)
POTASSIUM: 4.4 meq/L (ref 3.5–5.1)
SODIUM: 137 meq/L (ref 135–145)

## 2014-12-21 MED ORDER — PHENAZOPYRIDINE HCL 100 MG PO TABS: 100.0000 mg | ORAL_TABLET | Freq: Three times a day (TID) | ORAL | Status: DC | PRN

## 2014-12-21 NOTE — Progress Notes (Signed)
Subjective:  Patient ID: Kathleen Harris, female    DOB: [birth year]  Age: 69 y.o. MRN: [id number]  CC: Annual Exam   HPI [name] presents for a well exam  Outpatient Prescriptions Prior to Visit  Medication Sig Dispense Refill  . aspirin 81 MG tablet Take 81 mg by mouth daily.    Marland Kitchen b complex vitamins tablet Take 1 tablet by mouth daily.    . calcium & magnesium carbonates (MYLANTA) 311-232 MG per tablet Take 1 tablet by mouth daily.    . cholecalciferol (VITAMIN D) 1000 UNITS tablet Take 1,000 Units by mouth daily. d3    . Multiple Vitamins-Minerals (MULTIVITAMIN WITH MINERALS) tablet Take 1 tablet by mouth daily.    . non-metallic deodorant [name]) MISC Apply 1 application topically daily as needed.    Marland Kitchen OVER THE COUNTER MEDICATION Take 2 capsules by mouth 2 (two) times daily. Green Miracle: 2 capsules in am, 3rd capsule 2pm daily    . OVER THE COUNTER MEDICATION Take by mouth 2 (two) times daily. Arctic Cod Liver - Omega 3    . PHYTOSTEROLS PO Take 2 capsules by mouth 2 (two) times daily. 1 in am 1 in afternoon    . Probiotic Product (PROBIOTIC DAILY PO) Take by mouth daily.    . tamoxifen (NOLVADEX) 20 MG tablet TAKE ONE TABLET BY MOUTH ONCE DAILY 90 tablet 0  . vitamin C (ASCORBIC ACID) 500 MG tablet Take 500 mg by mouth daily.    . phenazopyridine (PYRIDIUM) 100 MG tablet Take 1 tablet (100 mg total) by mouth 3 (three) times daily as needed for pain. 30 tablet 0  . tamoxifen (NOLVADEX) 20 MG tablet TAKE ONE TABLET BY MOUTH ONCE DAILY 90 tablet 3   No facility-administered medications prior to visit.    ROS Review of Systems  Constitutional: Negative for chills, activity change, appetite change, fatigue and unexpected weight change.  HENT: Negative for congestion, mouth sores and sinus pressure.   Eyes: Negative for visual disturbance.  Respiratory: Negative for cough and chest tightness.   Gastrointestinal: Negative for nausea and abdominal pain.  Genitourinary:  Negative for frequency, difficulty urinating and vaginal pain.  Musculoskeletal: Negative for back pain and gait problem.  Skin: Negative for pallor and rash.  Neurological: Negative for dizziness, tremors, weakness, numbness and headaches.  Psychiatric/Behavioral: Negative for confusion and sleep disturbance.    Objective:  BP 118/60 mmHg  Pulse 76  Ht 5\' 3"  (1.6 m)  Wt 148 lb (67.132 kg)  BMI 26.22 kg/m2  SpO2 98%  BP Readings from Last 3 Encounters:  12/21/14 118/60  01/26/14 123/59  09/05/13 123/70    Wt Readings from Last 3 Encounters:  12/21/14 148 lb (67.132 kg)  01/26/14 151 lb 4.8 oz (68.629 kg)  09/05/13 151 lb (68.493 kg)    Physical Exam  Constitutional: She appears well-developed. No distress.  HENT:  Head: Normocephalic.  Right Ear: External ear normal.  Left Ear: External ear normal.  Nose: Nose normal.  Mouth/Throat: Oropharynx is clear and moist.  Eyes: Conjunctivae are normal. Pupils are equal, round, and reactive to light. Right eye exhibits no discharge. Left eye exhibits no discharge.  Neck: Normal range of motion. Neck supple. No JVD present. No tracheal deviation present. No thyromegaly present.  Cardiovascular: Normal rate, regular rhythm and normal heart sounds.   Pulmonary/Chest: No stridor. No respiratory distress. She has no wheezes.  Abdominal: Soft. Bowel sounds are normal. She exhibits no distension and no mass.  There is no tenderness. There is no rebound and no guarding.  Musculoskeletal: She exhibits no edema or tenderness.  Lymphadenopathy:    She has no cervical adenopathy.  Neurological: She displays normal reflexes. No cranial nerve deficit. She exhibits normal muscle tone. Coordination normal.  Skin: No rash noted. No erythema.  Psychiatric: She has a normal mood and affect. Her behavior is normal. Judgment and thought content normal.  trace LE edema  Lab Results  Component Value Date   WBC 6.0 01/26/2014   HGB 13.9 01/26/2014     HCT 41.6 01/26/2014   PLT 223 01/26/2014   GLUCOSE 188* 01/26/2014   CHOL 213* 02/16/2006   TRIG 181* 02/16/2006   HDL 47.6 02/16/2006   LDLDIRECT 139.4 02/16/2006   ALT 29 01/26/2014   AST 25 01/26/2014   NA 140 01/26/2014   K 3.9 01/26/2014   CL 98 02/12/2009   CREATININE 0.7 01/26/2014   BUN 9.7 01/26/2014   CO2 26 01/26/2014   TSH 1.42 03/18/2009   HGBA1C 5.6 02/16/2006    Mm Diag Breast Tomo Bilateral  05/22/2014  CLINICAL DATA:  Status post right lumpectomy and radiation therapy for breast cancer in 2014. EXAM: DIGITAL DIAGNOSTIC BILATERAL MAMMOGRAM WITH 3D TOMOSYNTHESIS AND CAD COMPARISON:  Previous examinations. ACR Breast Density Category c: The breast tissue is heterogeneously dense, which may obscure small masses. FINDINGS: Post lumpectomy and postradiation changes on the right. No findings elsewhere in either breast suspicious for malignancy. Mammographic images were processed with CAD. IMPRESSION: No evidence of malignancy. RECOMMENDATION: Bilateral diagnostic mammogram in 1 year. I have discussed the findings and recommendations with the patient. Results were also provided in writing at the conclusion of the visit. If applicable, a reminder letter will be sent to the patient regarding the next appointment. BI-RADS CATEGORY  2: Benign. Electronically Signed   By: Claudie Revering M.D.   On: 05/22/2014 10:09    Assessment & Plan:   [name] was seen today for annual exam.  Diagnoses and all orders for this visit:  Well adult exam -     EKG 12-Lead -     Basic metabolic panel; Future -     CBC with Differential/Platelet; Future -     Hepatic function panel; Future -     TSH; Future -     Urinalysis; Future -     Lipid panel; Future  Other orders -     phenazopyridine (PYRIDIUM) 100 MG tablet; Take 1 tablet (100 mg total) by mouth 3 (three) times daily as needed for pain.   I am having Ms. January maintain her b complex vitamins, aspirin, multivitamin with minerals,  OVER THE COUNTER MEDICATION, PHYTOSTEROLS PO, cholecalciferol, vitamin C, calcium & magnesium carbonates, non-metallic deodorant, OVER THE COUNTER MEDICATION, Probiotic Product (PROBIOTIC DAILY PO), tamoxifen, and phenazopyridine.  Meds ordered this encounter  Medications  . phenazopyridine (PYRIDIUM) 100 MG tablet    Sig: Take 1 tablet (100 mg total) by mouth 3 (three) times daily as needed for pain.    Dispense:  30 tablet    Refill:  1   EKG NSR  Follow-up: Return in about 1 year (around 12/21/2015) for Wellness Exam.  Walker Kehr, MD

## 2014-12-21 NOTE — Progress Notes (Signed)
Pre visit review using our clinic review tool, if applicable. No additional management support is needed unless otherwise documented below in the visit note. 

## 2014-12-21 NOTE — Assessment & Plan Note (Addendum)
We discussed age appropriate health related issues, including available/recomended screening tests and vaccinations. We discussed a need for adhering to healthy diet and exercise. Labs/EKG were reviewed/ordered. All questions were answered. Cologuard info Declined shots and colonoscopy

## 2014-12-22 LAB — CBC WITH DIFFERENTIAL/PLATELET
BASOS ABS: 0 10*3/uL (ref 0.0–0.1)
Basophils Relative: 0.5 % (ref 0.0–3.0)
Eosinophils Absolute: 0.1 10*3/uL (ref 0.0–0.7)
Eosinophils Relative: 2.3 % (ref 0.0–5.0)
HCT: 44.1 % (ref 36.0–46.0)
Hemoglobin: 14.9 g/dL (ref 12.0–15.0)
LYMPHS ABS: 2.2 10*3/uL (ref 0.7–4.0)
Lymphocytes Relative: 36.6 % (ref 12.0–46.0)
MCHC: 33.7 g/dL (ref 30.0–36.0)
MCV: 95.1 fl (ref 78.0–100.0)
MONO ABS: 0.4 10*3/uL (ref 0.1–1.0)
MONOS PCT: 6.4 % (ref 3.0–12.0)
NEUTROS PCT: 54.2 % (ref 43.0–77.0)
Neutro Abs: 3.3 10*3/uL (ref 1.4–7.7)
Platelets: 264 10*3/uL (ref 150.0–400.0)
RBC: 4.64 Mil/uL (ref 3.87–5.11)
RDW: 12.9 % (ref 11.5–15.5)
WBC: 6 10*3/uL (ref 4.0–10.5)

## 2014-12-23 NOTE — Addendum Note (Signed)
Addended by: Cassandria Anger on: 12/23/2014 05:43 PM   Modules accepted: Orders, SmartSet

## 2014-12-25 ENCOUNTER — Other Ambulatory Visit (INDEPENDENT_AMBULATORY_CARE_PROVIDER_SITE_OTHER): Payer: BC Managed Care – PPO

## 2014-12-25 DIAGNOSIS — R7309 Other abnormal glucose: Secondary | ICD-10-CM

## 2014-12-25 LAB — HEMOGLOBIN A1C: HEMOGLOBIN A1C: 8.2 % — AB (ref 4.6–6.5)

## 2015-01-26 ENCOUNTER — Other Ambulatory Visit (HOSPITAL_BASED_OUTPATIENT_CLINIC_OR_DEPARTMENT_OTHER): Payer: BC Managed Care – PPO

## 2015-01-26 ENCOUNTER — Telehealth: Payer: Self-pay | Admitting: Hematology and Oncology

## 2015-01-26 ENCOUNTER — Encounter: Payer: Self-pay | Admitting: Hematology and Oncology

## 2015-01-26 ENCOUNTER — Ambulatory Visit (HOSPITAL_BASED_OUTPATIENT_CLINIC_OR_DEPARTMENT_OTHER): Payer: BC Managed Care – PPO | Admitting: Hematology and Oncology

## 2015-01-26 VITALS — BP 96/44 | HR 81 | Temp 98.0°F | Resp 20 | Ht 63.0 in | Wt 143.7 lb

## 2015-01-26 DIAGNOSIS — C50412 Malignant neoplasm of upper-outer quadrant of left female breast: Secondary | ICD-10-CM

## 2015-01-26 DIAGNOSIS — Z170 Estrogen receptor positive status [ER+]: Secondary | ICD-10-CM | POA: Diagnosis not present

## 2015-01-26 DIAGNOSIS — C50411 Malignant neoplasm of upper-outer quadrant of right female breast: Secondary | ICD-10-CM

## 2015-01-26 DIAGNOSIS — Z79810 Long term (current) use of selective estrogen receptor modulators (SERMs): Secondary | ICD-10-CM | POA: Diagnosis not present

## 2015-01-26 LAB — CBC WITH DIFFERENTIAL/PLATELET
BASO%: 0.5 % (ref 0.0–2.0)
BASOS ABS: 0 10*3/uL (ref 0.0–0.1)
EOS ABS: 0.1 10*3/uL (ref 0.0–0.5)
EOS%: 1.4 % (ref 0.0–7.0)
HEMATOCRIT: 43.2 % (ref 34.8–46.6)
HEMOGLOBIN: 14.6 g/dL (ref 11.6–15.9)
LYMPH#: 2.4 10*3/uL (ref 0.9–3.3)
LYMPH%: 37.6 % (ref 14.0–49.7)
MCH: 31.9 pg (ref 25.1–34.0)
MCHC: 33.8 g/dL (ref 31.5–36.0)
MCV: 94.5 fL (ref 79.5–101.0)
MONO#: 0.5 10*3/uL (ref 0.1–0.9)
MONO%: 7.5 % (ref 0.0–14.0)
NEUT#: 3.4 10*3/uL (ref 1.5–6.5)
NEUT%: 53 % (ref 38.4–76.8)
Platelets: 220 10*3/uL (ref 145–400)
RBC: 4.57 10*6/uL (ref 3.70–5.45)
RDW: 12.6 % (ref 11.2–14.5)
WBC: 6.3 10*3/uL (ref 3.9–10.3)

## 2015-01-26 LAB — COMPREHENSIVE METABOLIC PANEL
ALBUMIN: 4 g/dL (ref 3.5–5.0)
ALK PHOS: 60 U/L (ref 40–150)
ALT: 25 U/L (ref 0–55)
AST: 24 U/L (ref 5–34)
Anion Gap: 9 mEq/L (ref 3–11)
BILIRUBIN TOTAL: 0.62 mg/dL (ref 0.20–1.20)
BUN: 13.5 mg/dL (ref 7.0–26.0)
CALCIUM: 9.9 mg/dL (ref 8.4–10.4)
CO2: 25 mEq/L (ref 22–29)
CREATININE: 0.8 mg/dL (ref 0.6–1.1)
Chloride: 104 mEq/L (ref 98–109)
EGFR: 79 mL/min/{1.73_m2} — ABNORMAL LOW (ref >90)
Glucose: 179 mg/dl — ABNORMAL HIGH (ref 70–140)
POTASSIUM: 4.6 meq/L (ref 3.5–5.1)
Sodium: 137 mEq/L (ref 136–145)
Total Protein: 6.3 g/dL — ABNORMAL LOW (ref 6.4–8.3)

## 2015-01-26 MED ORDER — TAMOXIFEN CITRATE 20 MG PO TABS: 20.0000 mg | ORAL_TABLET | Freq: Every day | ORAL | Status: DC

## 2015-01-26 NOTE — Telephone Encounter (Signed)
Appointments made and avs printed for patient °

## 2015-01-26 NOTE — Assessment & Plan Note (Signed)
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 Breast exam done by her gynecologist and at the breast center were normal May 2016, patient refused breast exam today 2. Mammograms 05/22/2014 normal, breast density category C  Return to clinic in 1 year for follow-up

## 2015-01-26 NOTE — Progress Notes (Signed)
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 Currently on hormonal therapy with tamoxifen.  She appears to be tolerating tamoxifen moderately well. She does have occasional hot flashes. She reports that her right breast is shrinking significantly. She had lost about 10 pounds and is trying to continuously lose weight so that she does not have to take insulin for her diabetes.  REVIEW OF SYSTEMS:   Constitutional: Denies fevers, chills or abnormal weight loss Eyes: Denies blurriness of vision Ears, nose, mouth, throat, and face: Denies mucositis or sore throat Respiratory: Denies cough, dyspnea or wheezes Cardiovascular: Denies palpitation, chest discomfort Gastrointestinal:  Denies nausea, heartburn or change in bowel habits Skin: Denies abnormal skin rashes Lymphatics: Denies new lymphadenopathy or easy bruising Neurological:Denies numbness, tingling or new  weaknesses Behavioral/Psych: Mood is stable, no new changes  Extremities: No lower extremity edema Breast:  denies any pain or lumps or nodules in either breasts All other systems were reviewed with the patient and are negative.  I have reviewed the past medical history, past surgical history, social history and family history with the patient and they are unchanged from previous note.  ALLERGIES:  is allergic to latex; effexor; penicillins; and sulfonamide derivatives.  MEDICATIONS:  Current Outpatient Prescriptions  Medication Sig Dispense Refill  . aspirin 81 MG tablet Take 81 mg by mouth daily.    Marland Kitchen b complex vitamins tablet Take 1 tablet by mouth daily.    . calcium & magnesium carbonates (MYLANTA) 311-232 MG per tablet Take 1 tablet by mouth daily.    . cholecalciferol (VITAMIN D) 1000 UNITS tablet Take 1,000 Units by mouth daily. d3    . Multiple Vitamins-Minerals (MULTIVITAMIN WITH MINERALS) tablet Take 1 tablet by mouth daily.    . non-metallic deodorant [name]) MISC Apply 1 application topically daily as needed.    Marland Kitchen OVER THE COUNTER MEDICATION Take 2 capsules by mouth 2 (two) times daily. Green Miracle: 2 capsules in am, 3rd capsule 2pm daily    . OVER THE COUNTER MEDICATION Take by mouth 2 (two) times daily. Arctic Cod Liver - Omega 3    . phenazopyridine (PYRIDIUM) 100 MG tablet Take 1 tablet (100 mg total) by mouth 3 (three) times daily as needed for pain. 30 tablet 1  . PHYTOSTEROLS PO Take 2 capsules by mouth 2 (two) times daily. 1 in am 1 in afternoon    . Probiotic Product (PROBIOTIC DAILY PO) Take  by mouth daily.    . tamoxifen (NOLVADEX) 20 MG tablet Take 1 tablet (20 mg total) by mouth daily. 90 tablet 3  . vitamin C (ASCORBIC ACID) 500 MG tablet Take 500 mg by mouth daily.     No current facility-administered medications for this visit.    PHYSICAL EXAMINATION: ECOG PERFORMANCE STATUS: 1 - Symptomatic but completely ambulatory  Filed Vitals:   01/26/15 1118   BP: 96/44  Pulse: 81  Temp: 98 F (36.7 C)  Resp: 20   Filed Weights   01/26/15 1118  Weight: 143 lb 11.2 oz (65.182 kg)    GENERAL:alert, no distress and comfortable SKIN: skin color, texture, turgor are normal, no rashes or significant lesions EYES: normal, Conjunctiva are pink and non-injected, sclera clear OROPHARYNX:no exudate, no erythema and lips, buccal mucosa, and tongue normal  NECK: supple, thyroid normal size, non-tender, without nodularity LYMPH:  no palpable lymphadenopathy in the cervical, axillary or inguinal LUNGS: clear to auscultation and percussion with normal breathing effort HEART: regular rate & rhythm and no murmurs and no lower extremity edema ABDOMEN:abdomen soft, non-tender and normal bowel sounds MUSCULOSKELETAL:no cyanosis of digits and no clubbing  NEURO: alert & oriented x 3 with fluent speech, no focal motor/sensory deficits EXTREMITIES: No lower extremity edema  LABORATORY DATA:  I have reviewed the data as listed   Chemistry      Component Value Date/Time   NA 137 01/26/2015 1058   NA 137 12/21/2014 0857   K 4.6 01/26/2015 1058   K 4.4 12/21/2014 0857   CL 101 12/21/2014 0857   CO2 25 01/26/2015 1058   CO2 25 12/21/2014 0857   BUN 13.5 01/26/2015 1058   BUN 11 12/21/2014 0857   CREATININE 0.8 01/26/2015 1058   CREATININE 0.59 12/21/2014 0857      Component Value Date/Time   CALCIUM 9.9 01/26/2015 1058   CALCIUM 10.0 12/21/2014 0857   ALKPHOS 60 01/26/2015 1058   ALKPHOS 62 12/21/2014 0857   AST 24 01/26/2015 1058   AST 36 12/21/2014 0857   ALT 25 01/26/2015 1058   ALT 36* 12/21/2014 0857   BILITOT 0.62 01/26/2015 1058   BILITOT 0.5 12/21/2014 0857       Lab Results  Component Value Date   WBC 6.3 01/26/2015   HGB 14.6 01/26/2015   HCT 43.2 01/26/2015   MCV 94.5 01/26/2015   PLT 220 01/26/2015   NEUTROABS 3.4 01/26/2015   ASSESSMENT & PLAN:  Breast cancer of upper-outer quadrant of right female breast ([place]) Invasive  ductal carcinoma stage I diagnosed May 2014 status post lumpectomy and radiation currently on tamoxifen 20 mg daily from August 2014  Tamoxifen toxicities: None other than hot flashes which she attributes to menopausal symptoms  Breast cancer surveillance: 1. Breast exam done by her gynecologist and at the breast center were normal May 2016, patient refused breast exam today. 2. Mammograms 05/22/2014 normal, breast density category C  Return to clinic in 1 year for follow-up   No orders of the defined types were placed in this encounter.   The patient has a good understanding of the overall plan. she agrees with it. she will call with any problems that may develop before the next visit here.   Rulon Eisenmenger, MD 01/26/2015

## 2015-03-30 ENCOUNTER — Other Ambulatory Visit: Payer: Self-pay | Admitting: Surgery

## 2015-03-30 DIAGNOSIS — Z853 Personal history of malignant neoplasm of breast: Secondary | ICD-10-CM

## 2015-05-24 ENCOUNTER — Ambulatory Visit
Admission: RE | Admit: 2015-05-24 | Discharge: 2015-05-24 | Disposition: A | Discharge status: Home / Self Care | Payer: BC Managed Care – PPO | Source: Ambulatory Visit | Attending: Surgery | Admitting: Surgery

## 2015-05-24 DIAGNOSIS — Z853 Personal history of malignant neoplasm of breast: Secondary | ICD-10-CM

## 2015-12-22 ENCOUNTER — Encounter: Payer: BC Managed Care – PPO | Admitting: Internal Medicine

## 2016-01-02 ENCOUNTER — Telehealth: Payer: Self-pay | Admitting: Hematology and Oncology

## 2016-01-02 NOTE — Telephone Encounter (Signed)
S/w pt, advised appt 12/28 moved due to md pal. Pt req a sooner appt. Gave appt 12/19 @ 8.15am.

## 2016-01-03 ENCOUNTER — Encounter: Payer: BC Managed Care – PPO | Admitting: Internal Medicine

## 2016-01-03 ENCOUNTER — Telehealth: Payer: Self-pay

## 2016-01-03 ENCOUNTER — Ambulatory Visit (INDEPENDENT_AMBULATORY_CARE_PROVIDER_SITE_OTHER): Payer: BC Managed Care – PPO | Admitting: Internal Medicine

## 2016-01-03 ENCOUNTER — Other Ambulatory Visit (INDEPENDENT_AMBULATORY_CARE_PROVIDER_SITE_OTHER): Payer: BC Managed Care – PPO

## 2016-01-03 ENCOUNTER — Encounter: Payer: Self-pay | Admitting: Internal Medicine

## 2016-01-03 VITALS — BP 130/66 | HR 85 | Ht 63.0 in | Wt 136.0 lb

## 2016-01-03 DIAGNOSIS — Z0001 Encounter for general adult medical examination with abnormal findings: Secondary | ICD-10-CM | POA: Diagnosis not present

## 2016-01-03 DIAGNOSIS — Z170 Estrogen receptor positive status [ER+]: Secondary | ICD-10-CM | POA: Diagnosis not present

## 2016-01-03 DIAGNOSIS — C50411 Malignant neoplasm of upper-outer quadrant of right female breast: Secondary | ICD-10-CM

## 2016-01-03 DIAGNOSIS — Z0000 Encounter for general adult medical examination without abnormal findings: Secondary | ICD-10-CM | POA: Diagnosis not present

## 2016-01-03 DIAGNOSIS — E119 Type 2 diabetes mellitus without complications: Secondary | ICD-10-CM

## 2016-01-03 DIAGNOSIS — E7800 Pure hypercholesterolemia, unspecified: Secondary | ICD-10-CM

## 2016-01-03 LAB — URINALYSIS
Bilirubin Urine: NEGATIVE
HGB URINE DIPSTICK: NEGATIVE
Leukocytes, UA: NEGATIVE
NITRITE: NEGATIVE
Specific Gravity, Urine: <=1.005 — AB (ref 1.000–1.030)
Total Protein, Urine: NEGATIVE
UROBILINOGEN UA: 0.2 (ref 0.0–1.0)
Urine Glucose: NEGATIVE
pH: 5.5 (ref 5.0–8.0)

## 2016-01-03 LAB — CBC WITH DIFFERENTIAL/PLATELET
BASOS ABS: 0 10*3/uL (ref 0.0–0.1)
Basophils Relative: 0.5 % (ref 0.0–3.0)
EOS ABS: 0.1 10*3/uL (ref 0.0–0.7)
Eosinophils Relative: 1.3 % (ref 0.0–5.0)
HCT: 43.3 % (ref 36.0–46.0)
Hemoglobin: 14.7 g/dL (ref 12.0–15.0)
LYMPHS ABS: 2.5 10*3/uL (ref 0.7–4.0)
Lymphocytes Relative: 37 % (ref 12.0–46.0)
MCHC: 34 g/dL (ref 30.0–36.0)
MCV: 93.4 fl (ref 78.0–100.0)
MONO ABS: 0.5 10*3/uL (ref 0.1–1.0)
MONOS PCT: 7.4 % (ref 3.0–12.0)
NEUTROS ABS: 3.7 10*3/uL (ref 1.4–7.7)
NEUTROS PCT: 53.8 % (ref 43.0–77.0)
PLATELETS: 241 10*3/uL (ref 150.0–400.0)
RBC: 4.63 Mil/uL (ref 3.87–5.11)
RDW: 13 % (ref 11.5–15.5)
WBC: 6.8 10*3/uL (ref 4.0–10.5)

## 2016-01-03 LAB — LIPID PANEL
CHOLESTEROL: 174 mg/dL (ref 0–200)
HDL: 56.9 mg/dL (ref >39.00)
LDL CALC: 89 mg/dL (ref 0–99)
NonHDL: 117.37
TRIGLYCERIDES: 140 mg/dL (ref 0.0–149.0)
Total CHOL/HDL Ratio: 3
VLDL: 28 mg/dL (ref 0.0–40.0)

## 2016-01-03 LAB — HEPATIC FUNCTION PANEL
ALBUMIN: 4.5 g/dL (ref 3.5–5.2)
ALT: 17 U/L (ref 0–35)
AST: 18 U/L (ref 0–37)
Alkaline Phosphatase: 53 U/L (ref 39–117)
BILIRUBIN TOTAL: 0.6 mg/dL (ref 0.2–1.2)
Bilirubin, Direct: 0.1 mg/dL (ref 0.0–0.3)
Total Protein: 6.7 g/dL (ref 6.0–8.3)

## 2016-01-03 LAB — BASIC METABOLIC PANEL
BUN: 12 mg/dL (ref 6–23)
CALCIUM: 10.4 mg/dL (ref 8.4–10.5)
CHLORIDE: 102 meq/L (ref 96–112)
CO2: 29 mEq/L (ref 19–32)
Creatinine, Ser: 0.6 mg/dL (ref 0.40–1.20)
GFR: 104.94 mL/min (ref >60.00)
Glucose, Bld: 168 mg/dL — ABNORMAL HIGH (ref 70–99)
Potassium: 4.1 mEq/L (ref 3.5–5.1)
SODIUM: 140 meq/L (ref 135–145)

## 2016-01-03 LAB — TSH: TSH: 1.72 u[IU]/mL (ref 0.35–4.50)

## 2016-01-03 LAB — GLUCOSE, POCT (MANUAL RESULT ENTRY): POC Glucose: 153 mg/dl — AB (ref 70–99)

## 2016-01-03 LAB — MICROALBUMIN / CREATININE URINE RATIO
Creatinine,U: 29.5 mg/dL
MICROALB/CREAT RATIO: 2.4 mg/g (ref 0.0–30.0)

## 2016-01-03 LAB — HEMOGLOBIN A1C: HEMOGLOBIN A1C: 7.5 % — AB (ref 4.6–6.5)

## 2016-01-03 LAB — HEPATITIS C ANTIBODY: HCV Ab: NEGATIVE

## 2016-01-03 MED ORDER — FREESTYLE LANCETS MISC
12 refills | Status: DC
Start: 2016-01-03 — End: 2016-01-11

## 2016-01-03 MED ORDER — METFORMIN HCL 500 MG PO TABS: 500.0000 mg | ORAL_TABLET | Freq: Every day | ORAL | 3 refills | Status: DC

## 2016-01-03 MED ORDER — GLUCOSE BLOOD VI STRP: ORAL_STRIP | 12 refills | Status: DC

## 2016-01-03 NOTE — Assessment & Plan Note (Signed)
F/u w/Dr Lindi Adie

## 2016-01-03 NOTE — Assessment & Plan Note (Signed)
Labs

## 2016-01-03 NOTE — Assessment & Plan Note (Addendum)
We discussed age appropriate health related issues, including available/recomended screening tests and vaccinations. We discussed a need for adhering to healthy diet and exercise. Labs/EKG were reviewed/ordered. All questions were answered. Pt refused colonoscopy, vaccines

## 2016-01-03 NOTE — Assessment & Plan Note (Addendum)
Labs today Start Metformin

## 2016-01-03 NOTE — Progress Notes (Signed)
Subjective:  Patient ID: Kathleen Harris, female    DOB: 09/23/1945  Age: 70 y.o. MRN: [id number]  CC: No chief complaint on file.   HPI [name] presents for a well exam. Pt lost wt. She had elevated glu before: we need to address DM   Outpatient Medications Prior to Visit  Medication Sig Dispense Refill  . aspirin 81 MG tablet Take 81 mg by mouth daily.    . cholecalciferol (VITAMIN D) 1000 UNITS tablet Take 1,000 Units by mouth daily. d3    . Multiple Vitamins-Minerals (MULTIVITAMIN WITH MINERALS) tablet Take 1 tablet by mouth daily.    . non-metallic deodorant [name]) MISC Apply 1 application topically daily as needed.    Marland Kitchen OVER THE COUNTER MEDICATION Take 2 capsules by mouth 2 (two) times daily. Green Miracle: 2 capsules in am, 3rd capsule 2pm daily    . OVER THE COUNTER MEDICATION Take by mouth 2 (two) times daily. Arctic Cod Liver - Omega 3    . phenazopyridine (PYRIDIUM) 100 MG tablet Take 1 tablet (100 mg total) by mouth 3 (three) times daily as needed for pain. 30 tablet 1  . PHYTOSTEROLS PO Take 2 capsules by mouth 2 (two) times daily. 1 in am 1 in afternoon    . Probiotic Product (PROBIOTIC DAILY PO) Take by mouth daily.    . tamoxifen (NOLVADEX) 20 MG tablet Take 1 tablet (20 mg total) by mouth daily. 90 tablet 3  . vitamin C (ASCORBIC ACID) 500 MG tablet Take 500 mg by mouth daily.    Marland Kitchen b complex vitamins tablet Take 1 tablet by mouth daily.    . calcium & magnesium carbonates (MYLANTA) 311-232 MG per tablet Take 1 tablet by mouth daily.     No facility-administered medications prior to visit.     ROS Review of Systems  Constitutional: Positive for unexpected weight change. Negative for activity change, appetite change, chills and fatigue.  HENT: Negative for congestion, mouth sores and sinus pressure.   Eyes: Negative for visual disturbance.  Respiratory: Negative for cough and chest tightness.   Gastrointestinal: Negative for abdominal pain and nausea.    Genitourinary: Negative for difficulty urinating, frequency and vaginal pain.  Musculoskeletal: Negative for back pain and gait problem.  Skin: Negative for pallor and rash.  Neurological: Negative for dizziness, tremors, weakness, numbness and headaches.  Psychiatric/Behavioral: Negative for confusion and sleep disturbance.    Objective:  BP 130/66   Pulse 85   Ht 5\' 3"  (1.6 m)   Wt 136 lb (61.7 kg)   SpO2 96%   BMI 24.09 kg/m   BP Readings from Last 3 Encounters:  01/03/16 130/66  01/26/15 (!) 96/44  12/21/14 118/60    Wt Readings from Last 3 Encounters:  01/03/16 136 lb (61.7 kg)  01/26/15 143 lb 11.2 oz (65.2 kg)  12/21/14 148 lb (67.1 kg)    Physical Exam  Constitutional: She appears well-developed. No distress.  HENT:  Head: Normocephalic.  Right Ear: External ear normal.  Left Ear: External ear normal.  Nose: Nose normal.  Mouth/Throat: Oropharynx is clear and moist.  Eyes: Conjunctivae are normal. Pupils are equal, round, and reactive to light. Right eye exhibits no discharge. Left eye exhibits no discharge.  Neck: Normal range of motion. Neck supple. No JVD present. No tracheal deviation present. No thyromegaly present.  Cardiovascular: Normal rate, regular rhythm and normal heart sounds.   Pulmonary/Chest: No stridor. No respiratory distress. She has no wheezes.  Abdominal:  Soft. Bowel sounds are normal. She exhibits no distension and no mass. There is no tenderness. There is no rebound and no guarding.  Musculoskeletal: She exhibits no edema or tenderness.  Lymphadenopathy:    She has no cervical adenopathy.  Neurological: She displays normal reflexes. No cranial nerve deficit. She exhibits normal muscle tone. Coordination normal.  Skin: No rash noted. No erythema.  Psychiatric: She has a normal mood and affect. Her behavior is normal. Judgment and thought content normal.    Lab Results  Component Value Date   WBC 6.3 01/26/2015   HGB 14.6 01/26/2015    HCT 43.2 01/26/2015   PLT 220 01/26/2015   GLUCOSE 179 (H) 01/26/2015   CHOL 176 12/21/2014   TRIG 139.0 12/21/2014   HDL 48.00 12/21/2014   LDLDIRECT 139.4 02/16/2006   LDLCALC 100 (H) 12/21/2014   ALT 25 01/26/2015   AST 24 01/26/2015   NA 137 01/26/2015   K 4.6 01/26/2015   CL 101 12/21/2014   CREATININE 0.8 01/26/2015   BUN 13.5 01/26/2015   CO2 25 01/26/2015   TSH 1.59 12/21/2014   HGBA1C 8.2 (H) 12/25/2014    Mm Diag Breast Tomo Bilateral  Result Date: 05/24/2015 CLINICAL DATA:  History of malignant lumpectomy of the right breast in 2014 for a grade 1 invasive ductal carcinoma. Follow-up evaluation. EXAM: 2D DIGITAL DIAGNOSTIC BILATERAL MAMMOGRAM WITH CAD AND ADJUNCT TOMO COMPARISON:  Previous exam(s). ACR Breast Density Category b: There are scattered areas of fibroglandular density. FINDINGS: There are mild scarring changes present within the upper-outer quadrant of the right breast related to patient's lumpectomy. There is no specific evidence for recurrent tumor or developing malignancy within either breast and the parenchymal pattern is stable. Mammographic images were processed with CAD. IMPRESSION: No findings worrisome for recurrent tumor or developing malignancy. RECOMMENDATION: Bilateral diagnostic mammography in 1 year. I have discussed the findings and recommendations with the patient. Results were also provided in writing at the conclusion of the visit. If applicable, a reminder letter will be sent to the patient regarding the next appointment. BI-RADS CATEGORY  1: Negative. Electronically Signed   By: Altamese Cabal M.D.   On: 05/24/2015 08:37    Assessment & Plan:   There are no diagnoses linked to this encounter. I am having Ms. Baehr maintain her b complex vitamins, aspirin, multivitamin with minerals, OVER THE COUNTER MEDICATION, PHYTOSTEROLS PO, cholecalciferol, vitamin C, calcium & magnesium carbonates, non-metallic deodorant, OVER THE COUNTER MEDICATION,  Probiotic Product (PROBIOTIC DAILY PO), phenazopyridine, and tamoxifen.  No orders of the defined types were placed in this encounter.    Follow-up: No Follow-up on file.  Walker Kehr, MD

## 2016-01-03 NOTE — Progress Notes (Signed)
Pre visit review using our clinic review tool, if applicable. No additional management support is needed unless otherwise documented below in the visit note. 

## 2016-01-11 ENCOUNTER — Other Ambulatory Visit: Payer: Self-pay | Admitting: *Deleted

## 2016-01-11 ENCOUNTER — Encounter: Payer: BC Managed Care – PPO | Admitting: Internal Medicine

## 2016-01-11 MED ORDER — GLUCOSE BLOOD VI STRP: 1.0000 | ORAL_STRIP | Freq: Two times a day (BID) | 5 refills | Status: DC

## 2016-01-18 ENCOUNTER — Encounter: Payer: Self-pay | Admitting: Hematology and Oncology

## 2016-01-18 ENCOUNTER — Ambulatory Visit (HOSPITAL_BASED_OUTPATIENT_CLINIC_OR_DEPARTMENT_OTHER): Payer: BC Managed Care – PPO | Admitting: Hematology and Oncology

## 2016-01-18 DIAGNOSIS — Z170 Estrogen receptor positive status [ER+]: Secondary | ICD-10-CM | POA: Diagnosis not present

## 2016-01-18 DIAGNOSIS — C50411 Malignant neoplasm of upper-outer quadrant of right female breast: Secondary | ICD-10-CM | POA: Diagnosis not present

## 2016-01-18 DIAGNOSIS — C50412 Malignant neoplasm of upper-outer quadrant of left female breast: Secondary | ICD-10-CM

## 2016-01-18 DIAGNOSIS — Z79810 Long term (current) use of selective estrogen receptor modulators (SERMs): Secondary | ICD-10-CM

## 2016-01-18 MED ORDER — TAMOXIFEN CITRATE 20 MG PO TABS: 20.0000 mg | ORAL_TABLET | Freq: Every day | ORAL | 3 refills | Status: DC

## 2016-01-18 NOTE — Progress Notes (Signed)
Patient Care Team: Cassandria Anger, MD as PCP - General (Internal Medicine)  DIAGNOSIS:  Encounter Diagnoses  Name Primary?  . Malignant neoplasm of upper-outer quadrant of right breast in female, estrogen receptor positive ([place])   . 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 Currently on hormonal therapy with tamoxifen.  She appears to be tolerating tamoxifen moderately well.  She was able to decrease her hemoglobin A1c by watching her diet. 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 She does continue to have hot flashes related to tamoxifen. She has profound fear of recurrence of breast cancer and does not want to ever come off tamoxifen.  ROS:  HEENT mouth, throat, and face: Denies mucositis or sore throat Respiratory: Denies cough, dyspnea or wheezes Cardiovascular: Denies palpitation, chest discomfort Gastrointestinal:  Denies nausea, heartburn or change in bowel habits Skin: Denies abnormal skin rashes Lymphatics: Denies new lymphadenopathy or easy bruising Neurological:Denies numbness, tingling or new  weaknesses Behavioral/Psych: Mood is stable, no new changes  Extremities: No lower extremity edema Breast:  denies any pain or lumps or nodules in either breasts All other systems were reviewed with the patient and are negative.  I have reviewed the past medical history, past surgical history, social history and family history with the patient and they are unchanged from previous note.  ALLERGIES:  is allergic to latex; effexor [venlafaxine]; penicillins; and sulfonamide derivatives.  MEDICATIONS:  Current Outpatient Prescriptions  Medication Sig Dispense Refill  . aspirin 81 MG tablet Take 81 mg by mouth daily.    . cholecalciferol (VITAMIN D) 1000 UNITS tablet Take 1,000 Units by mouth daily. d3    . glucose blood (FREESTYLE LITE) test strip 1 each by Other route 2 (two) times daily. Use to check blood sugars twice a day Dx E11.9 100 each 5  . metFORMIN (GLUCOPHAGE) 500 MG tablet Take 1 tablet (500 mg total) by mouth daily with breakfast. 90 tablet 3  . Multiple Vitamins-Minerals (MULTIVITAMIN WITH MINERALS) tablet Take 1 tablet by mouth daily.    . non-metallic deodorant [name]) MISC Apply 1 application topically daily as needed.    Marland Kitchen OVER THE COUNTER MEDICATION Take 2 capsules by mouth 2 (two) times daily. Green Miracle: 2 capsules in am, 3rd capsule 2pm daily    . OVER THE COUNTER MEDICATION Take by mouth 2 (two) times daily. Arctic Cod Liver - Omega 3    . phenazopyridine (PYRIDIUM) 100 MG tablet Take 1 tablet (100 mg total) by mouth  3 (three) times daily as needed for pain. 30 tablet 1  . PHYTOSTEROLS PO Take 2 capsules by mouth 2 (two) times daily. 1 in am 1 in afternoon    . Probiotic Product (PROBIOTIC DAILY PO) Take by mouth daily.    . tamoxifen (NOLVADEX) 20 MG tablet Take 1 tablet (20 mg total) by mouth daily. 90 tablet 3  . vitamin C (ASCORBIC ACID) 500 MG tablet Take 500 mg by mouth daily.     No current facility-administered medications for this visit.     PHYSICAL  EXAMINATION: ECOG PERFORMANCE STATUS: 0 - Asymptomatic  Vitals:   01/18/16 0847  BP: (!) 125/45  Pulse: 73  Resp: 18  Temp: 98.1 F (36.7 C)   Filed Weights   01/18/16 0847  Weight: 138 lb (62.6 kg)    GENERAL:alert, no distress and comfortable SKIN: skin color, texture, turgor are normal, no rashes or significant lesions EYES: normal, Conjunctiva are pink and non-injected, sclera clear OROPHARYNX:no exudate, no erythema and lips, buccal mucosa, and tongue normal  NECK: supple, thyroid normal size, non-tender, without nodularity LYMPH:  no palpable lymphadenopathy in the cervical, axillary or inguinal LUNGS: clear to auscultation and percussion with normal breathing effort HEART: regular rate & rhythm and no murmurs and no lower extremity edema ABDOMEN:abdomen soft, non-tender and normal bowel sounds MUSCULOSKELETAL:no cyanosis of digits and no clubbing  NEURO: alert & oriented x 3 with fluent speech, no focal motor/sensory deficits EXTREMITIES: No lower extremity edema LABORATORY DATA:  I have reviewed the data as listed   Chemistry      Component Value Date/Time   NA 140 01/03/2016 0907   NA 137 01/26/2015 1058   K 4.1 01/03/2016 0907   K 4.6 01/26/2015 1058   CL 102 01/03/2016 0907   CO2 29 01/03/2016 0907   CO2 25 01/26/2015 1058   BUN 12 01/03/2016 0907   BUN 13.5 01/26/2015 1058   CREATININE 0.60 01/03/2016 0907   CREATININE 0.8 01/26/2015 1058      Component Value Date/Time   CALCIUM 10.4 01/03/2016 0907   CALCIUM 9.9 01/26/2015 1058   ALKPHOS 53 01/03/2016 0907   ALKPHOS 60 01/26/2015 1058   AST 18 01/03/2016 0907   AST 24 01/26/2015 1058   ALT 17 01/03/2016 0907   ALT 25 01/26/2015 1058   BILITOT 0.6 01/03/2016 0907   BILITOT 0.62 01/26/2015 1058       Lab Results  Component Value Date   WBC 6.8 01/03/2016   HGB 14.7 01/03/2016   HCT 43.3 01/03/2016   MCV 93.4 01/03/2016   PLT 241.0 01/03/2016   NEUTROABS 3.7 01/03/2016    ASSESSMENT &  PLAN:  Breast cancer of upper-outer quadrant of right female breast ([place]) 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 Breast exam done by her gynecologist and at the breast center were normal May 2017, patient refused breast exam today. 2. Mammograms 05/24/2015 normal, breast density category B  Return to clinic in 1 year for follow-up    No orders of the defined types were placed in this encounter.  The patient has a good understanding of the overall plan. she agrees with it. she will call with any problems that may develop before  the next visit here.   Rulon Eisenmenger, MD 01/18/16

## 2016-01-18 NOTE — Assessment & Plan Note (Addendum)
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 Breast exam done by her gynecologist and at the breast center were normal May 2017, patient refused breast exam today. 2. Mammograms 05/24/2015 normal, breast density category B  Return to clinic in 1 year for follow-up

## 2016-01-27 ENCOUNTER — Ambulatory Visit: Payer: BC Managed Care – PPO | Admitting: Hematology and Oncology

## 2016-01-27 LAB — HM DIABETES EYE EXAM

## 2016-01-27 NOTE — Telephone Encounter (Signed)
error 

## 2016-03-01 ENCOUNTER — Ambulatory Visit: Payer: BC Managed Care – PPO | Admitting: Internal Medicine

## 2016-03-20 ENCOUNTER — Other Ambulatory Visit: Payer: Self-pay | Admitting: Surgery

## 2016-03-20 DIAGNOSIS — Z9889 Other specified postprocedural states: Secondary | ICD-10-CM

## 2016-04-03 ENCOUNTER — Ambulatory Visit: Payer: BC Managed Care – PPO | Admitting: Internal Medicine

## 2016-05-25 ENCOUNTER — Ambulatory Visit
Admission: RE | Admit: 2016-05-25 | Discharge: 2016-05-25 | Disposition: A | Discharge status: Home / Self Care | Payer: BC Managed Care – PPO | Source: Ambulatory Visit | Attending: Surgery | Admitting: Surgery

## 2016-05-25 DIAGNOSIS — Z9889 Other specified postprocedural states: Secondary | ICD-10-CM

## 2016-11-14 ENCOUNTER — Telehealth: Payer: Self-pay | Admitting: Hematology and Oncology

## 2016-11-15 ENCOUNTER — Telehealth: Payer: Self-pay | Admitting: Hematology and Oncology

## 2016-11-15 NOTE — Telephone Encounter (Signed)
Patient called in to reschedule appointment

## 2017-01-01 ENCOUNTER — Telehealth: Payer: Self-pay | Admitting: Internal Medicine

## 2017-01-01 MED ORDER — METFORMIN HCL 500 MG PO TABS: 500.0000 mg | ORAL_TABLET | Freq: Every day | ORAL | 0 refills | Status: DC

## 2017-01-01 NOTE — Telephone Encounter (Signed)
Copied from [place]. Topic: Quick Communication - Rx Refill/Question >> Jan 01, 2017  9:24 AM Lennox Solders wrote: Has the patient contacted their pharmacy? yes (Agent: If no, request that the patient contact the pharmacy for the refill.) pt needs refill on metformin 500 mg #90 Preferred Pharmacy (with phone number or street name): [facility] [address] [phone] Agent: Please be advised that RX refills may take up to 48 hours. We ask that you follow-up with your pharmacy.

## 2017-01-01 NOTE — Telephone Encounter (Signed)
Attempted to call pt but no answer and voicemail not set up. Medication refilled and sent to [facility] as requested.

## 2017-01-17 ENCOUNTER — Ambulatory Visit: Payer: BC Managed Care – PPO | Admitting: Hematology and Oncology

## 2017-01-23 NOTE — Assessment & Plan Note (Signed)
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 Breast exam done by her gynecologist and at the breast center were normal May 2018, patient refused breast exam today. 2. Mammograms 05/25/2016 normal, breast density category B  Return to clinic in 1 year for follow-up

## 2017-01-24 ENCOUNTER — Ambulatory Visit (HOSPITAL_BASED_OUTPATIENT_CLINIC_OR_DEPARTMENT_OTHER): Payer: BC Managed Care – PPO | Admitting: Hematology and Oncology

## 2017-01-24 ENCOUNTER — Telehealth: Payer: Self-pay | Admitting: Hematology and Oncology

## 2017-01-24 DIAGNOSIS — Z170 Estrogen receptor positive status [ER+]: Secondary | ICD-10-CM | POA: Diagnosis not present

## 2017-01-24 DIAGNOSIS — C50411 Malignant neoplasm of upper-outer quadrant of right female breast: Secondary | ICD-10-CM | POA: Diagnosis not present

## 2017-01-24 DIAGNOSIS — C50412 Malignant neoplasm of upper-outer quadrant of left female breast: Secondary | ICD-10-CM

## 2017-01-24 DIAGNOSIS — Z79810 Long term (current) use of selective estrogen receptor modulators (SERMs): Secondary | ICD-10-CM | POA: Diagnosis not present

## 2017-01-24 MED ORDER — TAMOXIFEN CITRATE 20 MG PO TABS: 20.0000 mg | ORAL_TABLET | Freq: Every day | ORAL | 3 refills | Status: DC

## 2017-01-24 NOTE — Progress Notes (Signed)
Patient Care Team: Plotnikov, Evie Lacks, MD as PCP - General (Internal Medicine)  DIAGNOSIS:  Encounter Diagnoses  Name Primary?  . Malignant neoplasm of upper-outer quadrant of right breast in female, estrogen receptor positive ([place])   . 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, ER 100%, PR 100%, K6 is on 15%, HER-2 negative      06/10/2012 Breast MRI    Breast MRI showing 1.4 cm mass upper outer quadrant right breast      06/27/2012 Surgery    Right breast partial mastectomy and SLN biopsy: 0.9 cm, grade 1, IDC with DCIS, 2 SLN negative      08/05/2012 - 08/26/2012 Radiation Therapy    Adjuvant radiation therapy      09/26/2012 -  Anti-estrogen oral therapy    Tamoxifen 20 mg daily       CHIEF COMPLIANT: Annual follow-up on tamoxifen  INTERVAL HISTORY: ADYLIN HANKEY is a 71 year old with above-mentioned history of right breast cancer treated with lumpectomy followed by adjuvant radiation and is currently on tamoxifen for invasive ductal carcinoma.  She appears to be tolerating tamoxifen fairly well.  She continues to have intermittent hot flashes which wake her up at night multiple times but she is keen on remaining on tamoxifen because her sister got diagnosed with recurrent breast cancer and she is very anxious about it.  She denies any muscle cramps or aches.  Patient denies any lumps or nodules in the breast.  REVIEW OF SYSTEMS:   Constitutional: Denies fevers, chills or abnormal weight loss Eyes: Denies blurriness of vision Ears, nose, mouth, throat, and face: Denies mucositis or sore throat Respiratory: Denies cough, dyspnea or wheezes Cardiovascular: Denies palpitation, chest  discomfort Gastrointestinal:  Denies nausea, heartburn or change in bowel habits Skin: Denies abnormal skin rashes Lymphatics: Denies new lymphadenopathy or easy bruising Neurological:Denies numbness, tingling or new weaknesses Behavioral/Psych: Mood is stable, no new changes  Extremities: No lower extremity edema Breast:  denies any pain or lumps or nodules in either breasts All other systems were reviewed with the patient and are negative.  I have reviewed the past medical history, past surgical history, social history and family history with the patient and they are unchanged from previous note.  ALLERGIES:  is allergic to latex; effexor [venlafaxine]; penicillins; and sulfonamide derivatives.  MEDICATIONS:  Current Outpatient Medications  Medication Sig Dispense Refill  . aspirin 81 MG tablet Take 81 mg by mouth daily.    . cholecalciferol (VITAMIN D) 1000 UNITS tablet Take 1,000 Units by mouth daily. d3    . glucose blood (FREESTYLE LITE) test strip 1 each by Other route 2 (two) times daily. Use to check blood sugars twice a day Dx E11.9 100 each 5  . metFORMIN (GLUCOPHAGE) 500 MG tablet Take 1 tablet (500 mg total) by mouth daily with breakfast. 30 tablet 0  . Multiple Vitamins-Minerals (MULTIVITAMIN WITH MINERALS) tablet Take 1 tablet by mouth daily.    . non-metallic deodorant [name]) MISC Apply 1 application topically daily as needed.    Marland Kitchen OVER THE COUNTER MEDICATION Take 2 capsules by mouth 2 (two) times daily. Green Miracle: 2 capsules in am, 3rd capsule 2pm daily    . OVER THE COUNTER MEDICATION Take by  mouth 2 (two) times daily. Arctic Cod Liver - Omega 3    . phenazopyridine (PYRIDIUM) 100 MG tablet Take 1 tablet (100 mg total) by mouth 3 (three) times daily as needed for pain. 30 tablet 1  . PHYTOSTEROLS PO Take 2 capsules by mouth 2 (two) times daily. 1 in am 1 in afternoon    . Probiotic Product (PROBIOTIC DAILY PO) Take by mouth daily.    . tamoxifen (NOLVADEX) 20 MG  tablet Take 1 tablet (20 mg total) by mouth daily. 90 tablet 3  . vitamin C (ASCORBIC ACID) 500 MG tablet Take 500 mg by mouth daily.     No current facility-administered medications for this visit.     PHYSICAL EXAMINATION: ECOG PERFORMANCE STATUS: 1 - Symptomatic but completely ambulatory  Vitals:   01/24/17 0816  BP: (!) 131/59  Pulse: 75  Resp: 17  Temp: 97.6 F (36.4 C)  SpO2: 100%   Filed Weights   01/24/17 0816  Weight: 139 lb 6.4 oz (63.2 kg)    GENERAL:alert, no distress and comfortable SKIN: skin color, texture, turgor are normal, no rashes or significant lesions EYES: normal, Conjunctiva are pink and non-injected, sclera clear OROPHARYNX:no exudate, no erythema and lips, buccal mucosa, and tongue normal  NECK: supple, thyroid normal size, non-tender, without nodularity LYMPH:  no palpable lymphadenopathy in the cervical, axillary or inguinal LUNGS: clear to auscultation and percussion with normal breathing effort HEART: regular rate & rhythm and no murmurs and no lower extremity edema ABDOMEN:abdomen soft, non-tender and normal bowel sounds MUSCULOSKELETAL:no cyanosis of digits and no clubbing  NEURO: alert & oriented x 3 with fluent speech, no focal motor/sensory deficits EXTREMITIES: No lower extremity edema BREAST: No palpable masses or nodules in either right or left breasts. No palpable axillary supraclavicular or infraclavicular adenopathy no breast tenderness or nipple discharge. (exam performed in the presence of a chaperone)  LABORATORY DATA:  I have reviewed the data as listed   Chemistry      Component Value Date/Time   NA 140 01/03/2016 0907   NA 137 01/26/2015 1058   K 4.1 01/03/2016 0907   K 4.6 01/26/2015 1058   CL 102 01/03/2016 0907   CO2 29 01/03/2016 0907   CO2 25 01/26/2015 1058   BUN 12 01/03/2016 0907   BUN 13.5 01/26/2015 1058   CREATININE 0.60 01/03/2016 0907   CREATININE 0.8 01/26/2015 1058      Component Value Date/Time    CALCIUM 10.4 01/03/2016 0907   CALCIUM 9.9 01/26/2015 1058   ALKPHOS 53 01/03/2016 0907   ALKPHOS 60 01/26/2015 1058   AST 18 01/03/2016 0907   AST 24 01/26/2015 1058   ALT 17 01/03/2016 0907   ALT 25 01/26/2015 1058   BILITOT 0.6 01/03/2016 0907   BILITOT 0.62 01/26/2015 1058       Lab Results  Component Value Date   WBC 6.8 01/03/2016   HGB 14.7 01/03/2016   HCT 43.3 01/03/2016   MCV 93.4 01/03/2016   PLT 241.0 01/03/2016   NEUTROABS 3.7 01/03/2016    ASSESSMENT & PLAN:  Breast cancer of upper-outer quadrant of right female breast ([place]) 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 Breast exam done by her gynecologist  and at the breast center were normal May 2018, patient refused breast exam today. 2. Mammograms 05/25/2016 normal, breast density category B  Patient wanted me to check her lab work and hemoglobin A1c.  She has an appointment coming up with Dr. Alain Marion.  I instructed her to get all her blood work with her primary care physician. She is trying to follow keto diet and lose weight that way.  She has been disappointed that she has not lost any weight.  Return to clinic in 1 year for follow-up   I spent 25 minutes talking to the patient of which more than half was spent in counseling and coordination of care.  No orders of the defined types were placed in this encounter.  The patient has a good understanding of the overall plan. she agrees with it. she will call with any problems that may develop before the next visit here.   Harriette Ohara, MD 01/24/17

## 2017-01-24 NOTE — Telephone Encounter (Signed)
Scheduled appt per 12/26 los - sent reminder letter in the mail - f/u in one year.

## 2017-01-29 ENCOUNTER — Encounter: Payer: Self-pay | Admitting: Internal Medicine

## 2017-01-29 ENCOUNTER — Ambulatory Visit (INDEPENDENT_AMBULATORY_CARE_PROVIDER_SITE_OTHER): Payer: BC Managed Care – PPO | Admitting: Internal Medicine

## 2017-01-29 VITALS — BP 120/54 | HR 76 | Temp 97.9°F | Ht 63.0 in | Wt 133.0 lb

## 2017-01-29 DIAGNOSIS — E785 Hyperlipidemia, unspecified: Secondary | ICD-10-CM | POA: Diagnosis not present

## 2017-01-29 DIAGNOSIS — N951 Menopausal and female climacteric states: Secondary | ICD-10-CM | POA: Diagnosis not present

## 2017-01-29 DIAGNOSIS — R002 Palpitations: Secondary | ICD-10-CM | POA: Insufficient documentation

## 2017-01-29 DIAGNOSIS — E119 Type 2 diabetes mellitus without complications: Secondary | ICD-10-CM | POA: Diagnosis not present

## 2017-01-29 DIAGNOSIS — Z170 Estrogen receptor positive status [ER+]: Secondary | ICD-10-CM | POA: Diagnosis not present

## 2017-01-29 DIAGNOSIS — C50411 Malignant neoplasm of upper-outer quadrant of right female breast: Secondary | ICD-10-CM | POA: Diagnosis not present

## 2017-01-29 LAB — POCT GLYCOSYLATED HEMOGLOBIN (HGB A1C): HEMOGLOBIN A1C: 7.2

## 2017-01-29 MED ORDER — METFORMIN HCL 500 MG PO TABS: 500.0000 mg | ORAL_TABLET | Freq: Two times a day (BID) | ORAL | 11 refills | Status: DC

## 2017-01-29 MED ORDER — METFORMIN HCL 500 MG PO TABS: 500.0000 mg | ORAL_TABLET | Freq: Two times a day (BID) | ORAL | 3 refills | Status: DC

## 2017-01-29 NOTE — Assessment & Plan Note (Signed)
Dr Lindi Adie On Tamoxifen

## 2017-01-29 NOTE — Assessment & Plan Note (Signed)
EKG Card ref if not resolved

## 2017-01-29 NOTE — Patient Instructions (Signed)
Wt Readings from Last 3 Encounters:  01/29/17 133 lb (60.3 kg)  01/24/17 139 lb 6.4 oz (63.2 kg)  01/18/16 138 lb (62.6 kg)

## 2017-01-29 NOTE — Progress Notes (Signed)
Subjective:  Patient ID: Kathleen Harris, female    DOB: [birth year]  Age: 71 y.o. MRN: [id number]  CC: No chief complaint on file.   HPI Kathleen Harris presents for DM, hot flashes - better, breast cancer C/o occ palpitations  Outpatient Medications Prior to Visit  Medication Sig Dispense Refill  . aspirin 81 MG tablet Take 81 mg by mouth daily.    . cholecalciferol (VITAMIN D) 1000 UNITS tablet Take 1,000 Units by mouth daily. d3    . Coenzyme Q10 (CO Q 10 PO) Take by mouth.    . cyanocobalamin 1000 MCG tablet Take 1,000 mcg by mouth daily.    Marland Kitchen MAGNESIUM PO Take by mouth.    . metFORMIN (GLUCOPHAGE) 500 MG tablet Take 1 tablet (500 mg total) by mouth daily with breakfast. 30 tablet 0  . Multiple Vitamins-Minerals (MULTIVITAMIN WITH MINERALS) tablet Take 1 tablet by mouth daily.    Marland Kitchen OVER THE COUNTER MEDICATION Take by mouth 2 (two) times daily. Arctic Cod Liver - Omega 3    . phenazopyridine (PYRIDIUM) 100 MG tablet Take 1 tablet (100 mg total) by mouth 3 (three) times daily as needed for pain. 30 tablet 1  . tamoxifen (NOLVADEX) 20 MG tablet Take 1 tablet (20 mg total) by mouth daily. 90 tablet 3  . TURMERIC PO Take by mouth.    . vitamin C (ASCORBIC ACID) 500 MG tablet Take 500 mg by mouth daily.    Marland Kitchen glucose blood (FREESTYLE LITE) test strip 1 each by Other route 2 (two) times daily. Use to check blood sugars twice a day Dx E11.9 100 each 5  . non-metallic deodorant (ALRA) MISC Apply 1 application topically daily as needed.    Marland Kitchen OVER THE COUNTER MEDICATION Take 2 capsules by mouth 2 (two) times daily. Green Miracle: 2 capsules in am, 3rd capsule 2pm daily    . PHYTOSTEROLS PO Take 2 capsules by mouth 2 (two) times daily. 1 in am 1 in afternoon    . Probiotic Product (PROBIOTIC DAILY PO) Take by mouth daily.     No facility-administered medications prior to visit.     ROS Review of Systems  Constitutional: Positive for diaphoresis. Negative for activity change, appetite  change, chills, fatigue and unexpected weight change.  HENT: Negative for congestion, mouth sores and sinus pressure.   Eyes: Negative for visual disturbance.  Respiratory: Negative for cough, chest tightness, shortness of breath and wheezing.   Cardiovascular: Positive for palpitations. Negative for chest pain and leg swelling.  Gastrointestinal: Negative for abdominal pain and nausea.  Genitourinary: Negative for difficulty urinating, frequency and vaginal pain.  Musculoskeletal: Negative for back pain and gait problem.  Skin: Negative for pallor and rash.  Neurological: Negative for dizziness, tremors, weakness, numbness and headaches.  Psychiatric/Behavioral: Negative for confusion and sleep disturbance.    Objective:  BP (!) 120/54 (BP Location: Left Arm, Patient Position: Sitting, Cuff Size: Normal)   Pulse 76   Temp 97.9 F (36.6 C) (Oral)   Ht 5\' 3"  (1.6 m)   Wt 133 lb (60.3 kg)   SpO2 99%   BMI 23.56 kg/m   BP Readings from Last 3 Encounters:  01/29/17 (!) 120/54  01/24/17 (!) 131/59  01/18/16 (!) 125/45    Wt Readings from Last 3 Encounters:  01/29/17 133 lb (60.3 kg)  01/24/17 139 lb 6.4 oz (63.2 kg)  01/18/16 138 lb (62.6 kg)    Physical Exam  Constitutional: She appears well-developed. No distress.  HENT:  Head: Normocephalic.  Right Ear: External ear normal.  Left Ear: External ear normal.  Nose: Nose normal.  Mouth/Throat: Oropharynx is clear and moist.  Eyes: Conjunctivae are normal. Pupils are equal, round, and reactive to light. Right eye exhibits no discharge. Left eye exhibits no discharge.  Neck: Normal range of motion. Neck supple. No JVD present. No tracheal deviation present. No thyromegaly present.  Cardiovascular: Normal rate, regular rhythm and normal heart sounds.  Pulmonary/Chest: No stridor. No respiratory distress. She has no wheezes.  Abdominal: Soft. Bowel sounds are normal. She exhibits no distension and no mass. There is no  tenderness. There is no rebound and no guarding.  Musculoskeletal: She exhibits no edema or tenderness.  Lymphadenopathy:    She has no cervical adenopathy.  Neurological: She displays normal reflexes. No cranial nerve deficit. She exhibits normal muscle tone. Coordination normal.  Skin: No rash noted. No erythema.  Psychiatric: She has a normal mood and affect. Her behavior is normal. Judgment and thought content normal.    Procedure: EKG Indication: palpitations Impression: NSR. No acute changes.   Lab Results  Component Value Date   WBC 6.8 01/03/2016   HGB 14.7 01/03/2016   HCT 43.3 01/03/2016   PLT 241.0 01/03/2016   GLUCOSE 168 (H) 01/03/2016   CHOL 174 01/03/2016   TRIG 140.0 01/03/2016   HDL 56.90 01/03/2016   LDLDIRECT 139.4 02/16/2006   LDLCALC 89 01/03/2016   ALT 17 01/03/2016   AST 18 01/03/2016   NA 140 01/03/2016   K 4.1 01/03/2016   CL 102 01/03/2016   CREATININE 0.60 01/03/2016   BUN 12 01/03/2016   CO2 29 01/03/2016   TSH 1.72 01/03/2016   HGBA1C 7.5 (H) 01/03/2016   MICROALBUR <0.7 01/03/2016    Mm Diag Breast Tomo Bilateral  Result Date: 05/25/2016 CLINICAL DATA:  History of age right lumpectomy for breast carcinoma performed in May 2014. No current complaints. EXAM: 2D DIGITAL DIAGNOSTIC BILATERAL MAMMOGRAM WITH CAD AND ADJUNCT TOMO COMPARISON:  Previous exam(s). ACR Breast Density Category b: There are scattered areas of fibroglandular density. FINDINGS: Focal opacity with architectural distortion in the posterior upper outer right breast is stable reflecting the post lumpectomy scarring. There are no discrete masses or other areas of architectural distortion. There are no suspicious calcifications. Mammographic images were processed with CAD. IMPRESSION: 1. No evidence of recurrent or new breast malignancy. 2. Benign postsurgical changes on the right. RECOMMENDATION: Diagnostic mammography in 1 year per standard post lumpectomy protocol. I have discussed  the findings and recommendations with the patient. Results were also provided in writing at the conclusion of the visit. If applicable, a reminder letter will be sent to the patient regarding the next appointment. BI-RADS CATEGORY  2: Benign. Electronically Signed   By: Lajean Manes M.D.   On: 05/25/2016 09:18    Assessment & Plan:   There are no diagnoses linked to this encounter. I have discontinued Uriah B. Binette's PHYTOSTEROLS PO, non-metallic deodorant, Probiotic Product (PROBIOTIC DAILY PO), and glucose blood. I am also having her maintain her aspirin, multivitamin with minerals, cholecalciferol, vitamin C, OVER THE COUNTER MEDICATION, phenazopyridine, metFORMIN, tamoxifen, cyanocobalamin, TURMERIC PO, MAGNESIUM PO, and Coenzyme Q10 (CO Q 10 PO).  No orders of the defined types were placed in this encounter.    Follow-up: No Follow-up on file.  Walker Kehr, MD

## 2017-01-29 NOTE — Assessment & Plan Note (Signed)
Doing better.   

## 2017-01-29 NOTE — Assessment & Plan Note (Signed)
On Metformin 

## 2017-01-31 ENCOUNTER — Telehealth: Payer: Self-pay | Admitting: Internal Medicine

## 2017-01-31 MED ORDER — METFORMIN HCL 500 MG PO TABS: 500.0000 mg | ORAL_TABLET | Freq: Two times a day (BID) | ORAL | 3 refills | Status: DC

## 2017-01-31 NOTE — Telephone Encounter (Signed)
Copied from [place] [id number]. Topic: Quick Communication - See Telephone Encounter >> Jan 31, 2017  1:52 PM Hewitt Shorts wrote: CRM for notification. See Telephone encounter for:  Pt states that she was seen on 01/29/17 and the metformin was not called in .  [facility] [address]  Best number for pt is [phone] 01/31/17.

## 2017-01-31 NOTE — Telephone Encounter (Signed)
Sent to wrong pharmacy. Resnt to [employer].

## 2017-03-06 ENCOUNTER — Other Ambulatory Visit: Payer: Self-pay | Admitting: Surgery

## 2017-03-06 ENCOUNTER — Other Ambulatory Visit: Payer: Self-pay | Admitting: Internal Medicine

## 2017-03-06 DIAGNOSIS — Z9889 Other specified postprocedural states: Secondary | ICD-10-CM

## 2017-04-27 ENCOUNTER — Ambulatory Visit (INDEPENDENT_AMBULATORY_CARE_PROVIDER_SITE_OTHER)
Admission: RE | Admit: 2017-04-27 | Discharge: 2017-04-27 | Disposition: A | Discharge status: Home / Self Care | Payer: BC Managed Care – PPO | Source: Ambulatory Visit | Attending: Internal Medicine | Admitting: Internal Medicine

## 2017-04-27 ENCOUNTER — Ambulatory Visit: Payer: BC Managed Care – PPO | Admitting: Internal Medicine

## 2017-04-27 ENCOUNTER — Other Ambulatory Visit (INDEPENDENT_AMBULATORY_CARE_PROVIDER_SITE_OTHER): Payer: BC Managed Care – PPO

## 2017-04-27 ENCOUNTER — Encounter: Payer: Self-pay | Admitting: Internal Medicine

## 2017-04-27 DIAGNOSIS — E785 Hyperlipidemia, unspecified: Secondary | ICD-10-CM

## 2017-04-27 DIAGNOSIS — N951 Menopausal and female climacteric states: Secondary | ICD-10-CM | POA: Diagnosis not present

## 2017-04-27 DIAGNOSIS — J069 Acute upper respiratory infection, unspecified: Secondary | ICD-10-CM

## 2017-04-27 DIAGNOSIS — E119 Type 2 diabetes mellitus without complications: Secondary | ICD-10-CM

## 2017-04-27 DIAGNOSIS — Z170 Estrogen receptor positive status [ER+]: Secondary | ICD-10-CM

## 2017-04-27 DIAGNOSIS — C50411 Malignant neoplasm of upper-outer quadrant of right female breast: Secondary | ICD-10-CM | POA: Diagnosis not present

## 2017-04-27 LAB — MICROALBUMIN / CREATININE URINE RATIO
Creatinine,U: 113 mg/dL
MICROALB/CREAT RATIO: 1.4 mg/g (ref 0.0–30.0)
Microalb, Ur: 1.6 mg/dL (ref 0.0–1.9)

## 2017-04-27 LAB — CBC WITH DIFFERENTIAL/PLATELET
BASOS PCT: 0.6 % (ref 0.0–3.0)
Basophils Absolute: 0 10*3/uL (ref 0.0–0.1)
EOS PCT: 1.9 % (ref 0.0–5.0)
Eosinophils Absolute: 0.1 10*3/uL (ref 0.0–0.7)
HEMATOCRIT: 39.5 % (ref 36.0–46.0)
HEMOGLOBIN: 13.5 g/dL (ref 12.0–15.0)
LYMPHS PCT: 35.6 % (ref 12.0–46.0)
Lymphs Abs: 2.5 10*3/uL (ref 0.7–4.0)
MCHC: 34.3 g/dL (ref 30.0–36.0)
MCV: 92.9 fl (ref 78.0–100.0)
MONO ABS: 0.6 10*3/uL (ref 0.1–1.0)
MONOS PCT: 7.8 % (ref 3.0–12.0)
Neutro Abs: 3.9 10*3/uL (ref 1.4–7.7)
Neutrophils Relative %: 54.1 % (ref 43.0–77.0)
Platelets: 336 10*3/uL (ref 150.0–400.0)
RBC: 4.25 Mil/uL (ref 3.87–5.11)
RDW: 12.7 % (ref 11.5–15.5)
WBC: 7.1 10*3/uL (ref 4.0–10.5)

## 2017-04-27 LAB — BASIC METABOLIC PANEL
BUN: 16 mg/dL (ref 6–23)
CALCIUM: 9.8 mg/dL (ref 8.4–10.5)
CO2: 29 meq/L (ref 19–32)
Chloride: 102 mEq/L (ref 96–112)
Creatinine, Ser: 0.62 mg/dL (ref 0.40–1.20)
GFR: 100.66 mL/min (ref >60.00)
GLUCOSE: 199 mg/dL — AB (ref 70–99)
POTASSIUM: 3.9 meq/L (ref 3.5–5.1)
SODIUM: 139 meq/L (ref 135–145)

## 2017-04-27 LAB — LIPID PANEL
CHOL/HDL RATIO: 3
CHOLESTEROL: 164 mg/dL (ref 0–200)
HDL: 50.6 mg/dL (ref >39.00)
LDL CALC: 92 mg/dL (ref 0–99)
NONHDL: 113.08
Triglycerides: 105 mg/dL (ref 0.0–149.0)
VLDL: 21 mg/dL (ref 0.0–40.0)

## 2017-04-27 LAB — URINALYSIS
BILIRUBIN URINE: NEGATIVE
Ketones, ur: 15 — AB
LEUKOCYTES UA: NEGATIVE
NITRITE: NEGATIVE
Specific Gravity, Urine: >=1.03 — AB (ref 1.000–1.030)
Total Protein, Urine: NEGATIVE
Urine Glucose: NEGATIVE
Urobilinogen, UA: 0.2 (ref 0.0–1.0)
pH: 5.5 (ref 5.0–8.0)

## 2017-04-27 LAB — HEPATIC FUNCTION PANEL
ALT: 14 U/L (ref 0–35)
AST: 16 U/L (ref 0–37)
Albumin: 4 g/dL (ref 3.5–5.2)
Alkaline Phosphatase: 55 U/L (ref 39–117)
BILIRUBIN DIRECT: 0.1 mg/dL (ref 0.0–0.3)
BILIRUBIN TOTAL: 0.5 mg/dL (ref 0.2–1.2)
Total Protein: 6.4 g/dL (ref 6.0–8.3)

## 2017-04-27 LAB — HEMOGLOBIN A1C: Hgb A1c MFr Bld: 7.4 % — ABNORMAL HIGH (ref 4.6–6.5)

## 2017-04-27 LAB — TSH: TSH: 1.98 u[IU]/mL (ref 0.35–4.50)

## 2017-04-27 MED ORDER — AZITHROMYCIN 250 MG PO TABS: ORAL_TABLET | ORAL | 0 refills | Status: DC

## 2017-04-27 NOTE — Patient Instructions (Signed)
You can use over-the-counter  "cold" medicines  such as "Tylenol cold" , "Advil cold",  "Mucinex" or" Mucinex D"  for cough and congestion.   Avoid decongestants if you have high blood pressure and use "Afrin" nasal spray for nasal congestion as directed. Use " Delsym" or" Robitussin" cough syrup varietis for cough.  You can use plain "Tylenol" or "Advil" for fever, chills and achyness. Use Halls or Ricola cough drops.   Please, make an appointment if you are not better or if you're worse.  

## 2017-04-27 NOTE — Assessment & Plan Note (Signed)
Due to Tamoxifen

## 2017-04-27 NOTE — Assessment & Plan Note (Signed)
CXR Zpac 

## 2017-04-27 NOTE — Progress Notes (Signed)
Subjective:  Patient ID: Kathleen Harris, female    DOB: 1945/07/01  Age: 72 y.o. MRN: [id number]  CC: No chief complaint on file.   HPI Kathleen Harris presents for DM, breast ca f/u C/u URI x 3 weeks, chills   Outpatient Medications Prior to Visit  Medication Sig Dispense Refill  . aspirin 81 MG tablet Take 81 mg by mouth daily.    . cholecalciferol (VITAMIN D) 1000 UNITS tablet Take 1,000 Units by mouth daily. d3    . Coenzyme Q10 (CO Q 10 PO) Take by mouth.    . cyanocobalamin 1000 MCG tablet Take 1,000 mcg by mouth daily.    Marland Kitchen MAGNESIUM PO Take by mouth.    . metFORMIN (GLUCOPHAGE) 500 MG tablet Take 1 tablet (500 mg total) by mouth 2 (two) times daily with a meal. 180 tablet 3  . Multiple Vitamins-Minerals (MULTIVITAMIN WITH MINERALS) tablet Take 1 tablet by mouth daily.    Marland Kitchen OVER THE COUNTER MEDICATION Take by mouth 2 (two) times daily. Arctic Cod Liver - Omega 3    . phenazopyridine (PYRIDIUM) 100 MG tablet Take 1 tablet (100 mg total) by mouth 3 (three) times daily as needed for pain. 30 tablet 1  . tamoxifen (NOLVADEX) 20 MG tablet Take 1 tablet (20 mg total) by mouth daily. 90 tablet 3  . TURMERIC PO Take by mouth.    . vitamin C (ASCORBIC ACID) 500 MG tablet Take 500 mg by mouth daily.     No facility-administered medications prior to visit.     ROS Review of Systems  Constitutional: Negative for activity change, appetite change, chills, fatigue and unexpected weight change.  HENT: Positive for congestion and postnasal drip. Negative for mouth sores and sinus pressure.   Eyes: Negative for visual disturbance.  Respiratory: Positive for cough. Negative for chest tightness.   Gastrointestinal: Negative for abdominal pain and nausea.  Genitourinary: Negative for difficulty urinating, frequency and vaginal pain.  Musculoskeletal: Negative for back pain and gait problem.  Skin: Negative for pallor and rash.  Neurological: Negative for dizziness, tremors, weakness,  numbness and headaches.  Psychiatric/Behavioral: Negative for confusion and sleep disturbance.    Objective:  BP 124/66 (BP Location: Left Arm, Patient Position: Sitting, Cuff Size: Normal)   Pulse 60   Temp 98.3 F (36.8 C) (Oral)   Ht 5\' 3"  (1.6 m)   Wt 129 lb (58.5 kg)   SpO2 98%   BMI 22.85 kg/m   BP Readings from Last 3 Encounters:  04/27/17 124/66  01/29/17 (!) 120/54  01/24/17 (!) 131/59    Wt Readings from Last 3 Encounters:  04/27/17 129 lb (58.5 kg)  01/29/17 133 lb (60.3 kg)  01/24/17 139 lb 6.4 oz (63.2 kg)    Physical Exam  Constitutional: She appears well-developed. No distress.  HENT:  Head: Normocephalic.  Right Ear: External ear normal.  Left Ear: External ear normal.  Nose: Nose normal.  Mouth/Throat: Oropharynx is clear and moist.  Eyes: Pupils are equal, round, and reactive to light. Conjunctivae are normal. Right eye exhibits no discharge. Left eye exhibits no discharge.  Neck: Normal range of motion. Neck supple. No JVD present. No tracheal deviation present. No thyromegaly present.  Cardiovascular: Normal rate, regular rhythm and normal heart sounds.  Pulmonary/Chest: No stridor. No respiratory distress. She has no wheezes.  Abdominal: Soft. Bowel sounds are normal. She exhibits no distension and no mass. There is no tenderness. There is no rebound and no guarding.  Musculoskeletal: She exhibits no edema or tenderness.  Lymphadenopathy:    She has no cervical adenopathy.  Neurological: She displays normal reflexes. No cranial nerve deficit. She exhibits normal muscle tone. Coordination normal.  Skin: No rash noted. No erythema.  Psychiatric: She has a normal mood and affect. Her behavior is normal. Judgment and thought content normal.    Lab Results  Component Value Date   WBC 6.8 01/03/2016   HGB 14.7 01/03/2016   HCT 43.3 01/03/2016   PLT 241.0 01/03/2016   GLUCOSE 168 (H) 01/03/2016   CHOL 174 01/03/2016   TRIG 140.0 01/03/2016   HDL  56.90 01/03/2016   LDLDIRECT 139.4 02/16/2006   LDLCALC 89 01/03/2016   ALT 17 01/03/2016   AST 18 01/03/2016   NA 140 01/03/2016   K 4.1 01/03/2016   CL 102 01/03/2016   CREATININE 0.60 01/03/2016   BUN 12 01/03/2016   CO2 29 01/03/2016   TSH 1.72 01/03/2016   HGBA1C 7.2 01/29/2017   MICROALBUR <0.7 01/03/2016    Mm Diag Breast Tomo Bilateral  Result Date: 05/25/2016 CLINICAL DATA:  History of age right lumpectomy for breast carcinoma performed in May 2014. No current complaints. EXAM: 2D DIGITAL DIAGNOSTIC BILATERAL MAMMOGRAM WITH CAD AND ADJUNCT TOMO COMPARISON:  Previous exam(s). ACR Breast Density Category b: There are scattered areas of fibroglandular density. FINDINGS: Focal opacity with architectural distortion in the posterior upper outer right breast is stable reflecting the post lumpectomy scarring. There are no discrete masses or other areas of architectural distortion. There are no suspicious calcifications. Mammographic images were processed with CAD. IMPRESSION: 1. No evidence of recurrent or new breast malignancy. 2. Benign postsurgical changes on the right. RECOMMENDATION: Diagnostic mammography in 1 year per standard post lumpectomy protocol. I have discussed the findings and recommendations with the patient. Results were also provided in writing at the conclusion of the visit. If applicable, a reminder letter will be sent to the patient regarding the next appointment. BI-RADS CATEGORY  2: Benign. Electronically Signed   By: Lajean Manes M.D.   On: 05/25/2016 09:18    Assessment & Plan:   There are no diagnoses linked to this encounter. I am having Kathleen Harris maintain her aspirin, multivitamin with minerals, cholecalciferol, vitamin C, OVER THE COUNTER MEDICATION, phenazopyridine, tamoxifen, cyanocobalamin, TURMERIC PO, MAGNESIUM PO, Coenzyme Q10 (CO Q 10 PO), and metFORMIN.  No orders of the defined types were placed in this encounter.    Follow-up: No  follow-ups on file.  Walker Kehr, MD

## 2017-04-27 NOTE — Assessment & Plan Note (Signed)
tamoxifen

## 2017-04-27 NOTE — Assessment & Plan Note (Addendum)
Metformin Labs done this am Pt refused to RTC before Jan 20120 for a f/u

## 2017-05-28 ENCOUNTER — Ambulatory Visit
Admission: RE | Admit: 2017-05-28 | Discharge: 2017-05-28 | Disposition: A | Discharge status: Home / Self Care | Payer: BC Managed Care – PPO | Source: Ambulatory Visit | Attending: Surgery | Admitting: Surgery

## 2017-05-28 DIAGNOSIS — Z9889 Other specified postprocedural states: Secondary | ICD-10-CM

## 2017-12-03 ENCOUNTER — Telehealth: Payer: Self-pay | Admitting: Hematology and Oncology

## 2017-12-03 NOTE — Telephone Encounter (Signed)
Called pt regarding vm she left to cancel appts. Pt will call to r/s

## 2017-12-20 ENCOUNTER — Telehealth: Payer: Self-pay | Admitting: Internal Medicine

## 2017-12-20 ENCOUNTER — Telehealth: Payer: Self-pay | Admitting: Hematology and Oncology

## 2017-12-20 NOTE — Telephone Encounter (Signed)
Tried to call about voicemail however patients voicemail was not set up

## 2017-12-20 NOTE — Telephone Encounter (Signed)
Patient called to reschedule  °

## 2018-01-02 ENCOUNTER — Telehealth: Payer: Self-pay | Admitting: Hematology and Oncology

## 2018-01-02 NOTE — Telephone Encounter (Signed)
Patient stopped by to rescheduled cancelled 12/26 f/u. Per patient she does have insurance and would like to be seen 12/26. Gave patient appointment for 12/26.

## 2018-01-24 ENCOUNTER — Inpatient Hospital Stay: Payer: Medicare Other | Attending: Hematology and Oncology | Admitting: Hematology and Oncology

## 2018-01-24 ENCOUNTER — Ambulatory Visit: Payer: BC Managed Care – PPO | Admitting: Hematology and Oncology

## 2018-01-24 ENCOUNTER — Telehealth: Payer: Self-pay | Admitting: Hematology and Oncology

## 2018-01-24 VITALS — BP 119/76 | HR 85 | Temp 98.3°F | Resp 18 | Wt 127.6 lb

## 2018-01-24 DIAGNOSIS — C50411 Malignant neoplasm of upper-outer quadrant of right female breast: Secondary | ICD-10-CM | POA: Diagnosis not present

## 2018-01-24 DIAGNOSIS — Z923 Personal history of irradiation: Secondary | ICD-10-CM | POA: Insufficient documentation

## 2018-01-24 DIAGNOSIS — Z170 Estrogen receptor positive status [ER+]: Secondary | ICD-10-CM | POA: Diagnosis not present

## 2018-01-24 DIAGNOSIS — Z79810 Long term (current) use of selective estrogen receptor modulators (SERMs): Secondary | ICD-10-CM | POA: Diagnosis not present

## 2018-01-24 DIAGNOSIS — Z7984 Long term (current) use of oral hypoglycemic drugs: Secondary | ICD-10-CM | POA: Diagnosis not present

## 2018-01-24 DIAGNOSIS — Z7982 Long term (current) use of aspirin: Secondary | ICD-10-CM | POA: Diagnosis not present

## 2018-01-24 DIAGNOSIS — Z79899 Other long term (current) drug therapy: Secondary | ICD-10-CM

## 2018-01-24 DIAGNOSIS — R251 Tremor, unspecified: Secondary | ICD-10-CM

## 2018-01-24 DIAGNOSIS — C50412 Malignant neoplasm of upper-outer quadrant of left female breast: Secondary | ICD-10-CM

## 2018-01-24 MED ORDER — TAMOXIFEN CITRATE 20 MG PO TABS: 20.0000 mg | ORAL_TABLET | Freq: Every day | ORAL | 3 refills | Status: DC

## 2018-01-24 NOTE — Assessment & Plan Note (Signed)
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 Breast exam done by her gynecologist and at the breast center were normal May 2019, patient refused breast exam today. 2. Mammograms 04/29/2019normal, breast density category B  Return to clinic in 1 year for follow-up

## 2018-01-24 NOTE — Telephone Encounter (Signed)
Scheduled appt per 12/26 los - gave patient AVS and calender per los. Sent referral via [facility] to [place]

## 2018-01-24 NOTE — Progress Notes (Signed)
Patient Care Team: Plotnikov, Evie Lacks, MD as PCP - General (Internal Medicine)  DIAGNOSIS:  Encounter Diagnosis  Name Primary?  . 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, ER 100%, PR 100%, K6 is on 15%, HER-2 negative    06/10/2012 Breast MRI    Breast MRI showing 1.4 cm mass upper outer quadrant right breast    06/27/2012 Surgery    Right breast partial mastectomy and SLN biopsy: 0.9 cm, grade 1, IDC with DCIS, 2 SLN negative    08/05/2012 - 08/26/2012 Radiation Therapy    Adjuvant radiation therapy    09/26/2012 -  Anti-estrogen oral therapy    Tamoxifen 20 mg daily     CHIEF COMPLIANT: Follow-up on tamoxifen therapy  INTERVAL HISTORY: Kathleen Harris is a 72 year old above-mentioned history of right breast cancer who is currently on tamoxifen and tolerating it extremely well.  She reports no lumps or nodules in the breast.  She is mainly concerned about the recent issues with tremors.  She is concerned that she may have Parkinson's disease.  REVIEW OF SYSTEMS:   Constitutional: Denies fevers, chills or abnormal weight loss Eyes: Denies blurriness of vision Ears, nose, mouth, throat, and face: Denies mucositis or sore throat Respiratory: Denies cough, dyspnea or wheezes Cardiovascular: Denies palpitation, chest discomfort Gastrointestinal:  Denies nausea, heartburn or change in bowel habits Skin: Denies abnormal skin rashes Lymphatics: Denies new lymphadenopathy or easy bruising Neurological:Denies numbness, tingling or new weaknesses, tremors both at rest and at the activity Behavioral/Psych: Mood is stable, no new changes  Extremities: No lower extremity edema Breast:  denies any pain or lumps or nodules  in either breasts All other systems were reviewed with the patient and are negative.  I have reviewed the past medical history, past surgical history, social history and family history with the patient and they are unchanged from previous note.  ALLERGIES:  is allergic to latex; codeine; effexor [venlafaxine]; penicillins; and sulfonamide derivatives.  MEDICATIONS:  Current Outpatient Medications  Medication Sig Dispense Refill  . aspirin 81 MG tablet Take 81 mg by mouth daily.    Marland Kitchen azithromycin (ZITHROMAX Z-PAK) 250 MG tablet As directed 6 each 0  . cholecalciferol (VITAMIN D) 1000 UNITS tablet Take 1,000 Units by mouth daily. d3    . Coenzyme Q10 (CO Q 10 PO) Take by mouth.    . cyanocobalamin 1000 MCG tablet Take 1,000 mcg by mouth daily.    Marland Kitchen MAGNESIUM PO Take by mouth.    . metFORMIN (GLUCOPHAGE) 500 MG tablet Take 1 tablet (500 mg total) by mouth 2 (two) times daily with a meal. 180 tablet 3  . Multiple Vitamins-Minerals (MULTIVITAMIN WITH MINERALS) tablet Take 1 tablet by mouth daily.    Marland Kitchen OVER THE COUNTER MEDICATION Take by mouth 2 (two) times daily. Arctic Cod Liver - Omega 3    . phenazopyridine (PYRIDIUM) 100 MG tablet Take 1 tablet (100 mg total) by mouth 3 (three) times daily as needed for pain. 30 tablet 1  . tamoxifen (NOLVADEX) 20 MG tablet Take 1 tablet (20 mg total) by mouth daily. 90 tablet 3  . TURMERIC PO Take by mouth.    . vitamin C (ASCORBIC ACID) 500 MG tablet Take 500 mg by  mouth daily.     No current facility-administered medications for this visit.     PHYSICAL EXAMINATION: ECOG PERFORMANCE STATUS: 1 - Symptomatic but completely ambulatory  Vitals:   01/24/18 1342  BP: 119/76  Pulse: 85  Resp: 18  Temp: 98.3 F (36.8 C)  SpO2: 100%   Filed Weights   01/24/18 1342  Weight: 127 lb 9.6 oz (57.9 kg)    GENERAL:alert, no distress and comfortable SKIN: skin color, texture, turgor are normal, no rashes or significant lesions EYES: normal, Conjunctiva  are pink and non-injected, sclera clear OROPHARYNX:no exudate, no erythema and lips, buccal mucosa, and tongue normal  NECK: supple, thyroid normal size, non-tender, without nodularity LYMPH:  no palpable lymphadenopathy in the cervical, axillary or inguinal LUNGS: clear to auscultation and percussion with normal breathing effort HEART: regular rate & rhythm and no murmurs and no lower extremity edema ABDOMEN:abdomen soft, non-tender and normal bowel sounds MUSCULOSKELETAL:no cyanosis of digits and no clubbing  NEURO: alert & oriented x 3 with fluent speech, no focal motor/sensory deficits, tremors both at rest and not intentional EXTREMITIES: No lower extremity edema   LABORATORY DATA:  I have reviewed the data as listed CMP Latest Ref Rng & Units 04/27/2017 01/03/2016 01/26/2015  Glucose 70 - 99 mg/dL 199(H) 168(H) 179(H)  BUN 6 - 23 mg/dL 16 12 13.5  Creatinine 0.40 - 1.20 mg/dL 0.62 0.60 0.8  Sodium 135 - 145 mEq/L 139 140 137  Potassium 3.5 - 5.1 mEq/L 3.9 4.1 4.6  Chloride 96 - 112 mEq/L 102 102 -  CO2 19 - 32 mEq/L _0 Calcium 8.4 - 10.5 mg/dL 9.8 10.4 9.9  Total Protein 6.0 - 8.3 g/dL 6.4 6.7 6.3(L)  Total Bilirubin 0.2 - 1.2 mg/dL 0.5 0.6 0.62  Alkaline Phos 39 - 117 U/L 55 53 60  AST 0 - 37 U/L _1 ALT 0 - 35 U/L _2 Lab Results  Component Value Date   WBC 7.1 04/27/2017   HGB 13.5 04/27/2017   HCT 39.5 04/27/2017   MCV 92.9 04/27/2017   PLT 336.0 04/27/2017   NEUTROABS 3.9 04/27/2017    ASSESSMENT & PLAN:  Breast cancer of upper-outer quadrant of right female breast ([place]) 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 Breast exam done by her gynecologist and at the  breast center were normal May 2019, patient refused breast exam today. 2. Mammograms 04/29/2019normal, breast density category B  Tremors and intermittent ataxia: Patient is very concerned about Parkinson's disease.  I will make a referral to neurology for further evaluation.  Return to clinic in 1 year for follow-up    No orders of the defined types were placed in this encounter.  The patient has a good understanding of the overall plan. she agrees with it. she will call with any problems that may develop before the next visit here.   Harriette Ohara, MD 01/24/18

## 2018-01-30 ENCOUNTER — Emergency Department (HOSPITAL_COMMUNITY)
Admission: EM | Admit: 2018-01-30 | Discharge: 2018-01-30 | Disposition: A | Discharge status: Home / Self Care | Payer: Medicare Other | Attending: Emergency Medicine | Admitting: Emergency Medicine

## 2018-01-30 ENCOUNTER — Emergency Department (HOSPITAL_COMMUNITY): Payer: Medicare Other

## 2018-01-30 ENCOUNTER — Encounter (HOSPITAL_COMMUNITY): Payer: Self-pay | Admitting: Emergency Medicine

## 2018-01-30 DIAGNOSIS — Z79899 Other long term (current) drug therapy: Secondary | ICD-10-CM | POA: Insufficient documentation

## 2018-01-30 DIAGNOSIS — J45909 Unspecified asthma, uncomplicated: Secondary | ICD-10-CM | POA: Diagnosis not present

## 2018-01-30 DIAGNOSIS — Y999 Unspecified external cause status: Secondary | ICD-10-CM | POA: Diagnosis not present

## 2018-01-30 DIAGNOSIS — W0110XA Fall on same level from slipping, tripping and stumbling with subsequent striking against unspecified object, initial encounter: Secondary | ICD-10-CM | POA: Insufficient documentation

## 2018-01-30 DIAGNOSIS — Y9301 Activity, walking, marching and hiking: Secondary | ICD-10-CM | POA: Insufficient documentation

## 2018-01-30 DIAGNOSIS — Z7984 Long term (current) use of oral hypoglycemic drugs: Secondary | ICD-10-CM | POA: Insufficient documentation

## 2018-01-30 DIAGNOSIS — S0990XA Unspecified injury of head, initial encounter: Secondary | ICD-10-CM | POA: Diagnosis present

## 2018-01-30 DIAGNOSIS — Z87891 Personal history of nicotine dependence: Secondary | ICD-10-CM | POA: Diagnosis not present

## 2018-01-30 DIAGNOSIS — S7002XA Contusion of left hip, initial encounter: Secondary | ICD-10-CM | POA: Insufficient documentation

## 2018-01-30 DIAGNOSIS — Z91040 Latex allergy status: Secondary | ICD-10-CM | POA: Diagnosis not present

## 2018-01-30 DIAGNOSIS — E119 Type 2 diabetes mellitus without complications: Secondary | ICD-10-CM | POA: Diagnosis not present

## 2018-01-30 DIAGNOSIS — Y929 Unspecified place or not applicable: Secondary | ICD-10-CM | POA: Insufficient documentation

## 2018-01-30 DIAGNOSIS — C50411 Malignant neoplasm of upper-outer quadrant of right female breast: Secondary | ICD-10-CM | POA: Diagnosis not present

## 2018-01-30 DIAGNOSIS — S022XXA Fracture of nasal bones, initial encounter for closed fracture: Secondary | ICD-10-CM | POA: Diagnosis not present

## 2018-01-30 DIAGNOSIS — Z7982 Long term (current) use of aspirin: Secondary | ICD-10-CM | POA: Insufficient documentation

## 2018-01-30 DIAGNOSIS — W19XXXA Unspecified fall, initial encounter: Secondary | ICD-10-CM

## 2018-01-30 LAB — CBC WITH DIFFERENTIAL/PLATELET
ABS IMMATURE GRANULOCYTES: 0.05 10*3/uL (ref 0.00–0.07)
BASOS ABS: 0 10*3/uL (ref 0.0–0.1)
Basophils Relative: 0 %
EOS PCT: 1 %
Eosinophils Absolute: 0.1 10*3/uL (ref 0.0–0.5)
HCT: 37.8 % (ref 36.0–46.0)
HEMOGLOBIN: 12.5 g/dL (ref 12.0–15.0)
Immature Granulocytes: 0 %
LYMPHS PCT: 23 %
Lymphs Abs: 2.8 10*3/uL (ref 0.7–4.0)
MCH: 30.9 pg (ref 26.0–34.0)
MCHC: 33.1 g/dL (ref 30.0–36.0)
MCV: 93.6 fL (ref 80.0–100.0)
Monocytes Absolute: 0.8 10*3/uL (ref 0.1–1.0)
Monocytes Relative: 6 %
NEUTROS ABS: 8.7 10*3/uL — AB (ref 1.7–7.7)
NRBC: 0 % (ref 0.0–0.2)
Neutrophils Relative %: 70 %
Platelets: 235 10*3/uL (ref 150–400)
RBC: 4.04 MIL/uL (ref 3.87–5.11)
RDW: 12.3 % (ref 11.5–15.5)
WBC: 12.5 10*3/uL — AB (ref 4.0–10.5)

## 2018-01-30 LAB — COMPREHENSIVE METABOLIC PANEL
ALBUMIN: 4 g/dL (ref 3.5–5.0)
ALK PHOS: 39 U/L (ref 38–126)
ALT: 13 U/L (ref 0–44)
ANION GAP: 10 (ref 5–15)
AST: 22 U/L (ref 15–41)
BUN: 10 mg/dL (ref 8–23)
CALCIUM: 10.2 mg/dL (ref 8.9–10.3)
CO2: 25 mmol/L (ref 22–32)
Chloride: 103 mmol/L (ref 98–111)
Creatinine, Ser: 0.75 mg/dL (ref 0.44–1.00)
GFR calc Af Amer: >60 mL/min (ref >60)
GFR calc non Af Amer: >60 mL/min (ref >60)
GLUCOSE: 188 mg/dL — AB (ref 70–99)
Potassium: 3.8 mmol/L (ref 3.5–5.1)
Sodium: 138 mmol/L (ref 135–145)
Total Bilirubin: 0.4 mg/dL (ref 0.3–1.2)
Total Protein: 5.8 g/dL — ABNORMAL LOW (ref 6.5–8.1)

## 2018-01-30 LAB — PROTIME-INR
INR: 1.01
Prothrombin Time: 13.2 seconds (ref 11.4–15.2)

## 2018-01-30 MED ORDER — HYDROCODONE-ACETAMINOPHEN 5-325 MG PO TABS: 1.0000 | ORAL_TABLET | Freq: Four times a day (QID) | ORAL | 0 refills | Status: DC | PRN

## 2018-01-30 NOTE — ED Notes (Signed)
Pt verbalized understanding of discharge instructions/prescriptions.  RN arranged for Security to take pt to her car.

## 2018-01-30 NOTE — ED Triage Notes (Signed)
Pt here for eval after a mechanical fall around 230pm. Pt has a laceration to her nose. She also has significant pain and swelling to her left hip. Pt states she did have a lot of blood loss from her nose. Pt is not on blood thinners.

## 2018-01-30 NOTE — ED Provider Notes (Signed)
[facility] EMERGENCY DEPARTMENT Provider Note   CSN: [id number] Arrival date & time: 01/30/18  1624     History   Chief Complaint Chief Complaint  Patient presents with  . Fall  . Hip Injury    left, sweling  . Facial Injury    HPI Kathleen Harris is a 73 y.o. female history of arthritis here presenting with fall.  Patient states that around 2:30 PM, she had tripped and fell and face planted.  Patient states that she started having bleeding across the bridge of her nose.  She also noticed progressive swelling of the left hip area.  She is able to ambulate and bear weight on the hip.  Denies loss of consciousness or passing out. Patient is not on blood thinners.   The history is provided by the patient.    Past Medical History:  Diagnosis Date  . Allergy   . Arthritis    fingers  . Asthma    as a child last attack teenager  . Hot flashes   . Hx of radiation therapy 08/05/12-08/26/12   right breast 4256 cGy 16 sessions  . Irregular heart beat   . Medical history non-contributory   . Wears glasses     Patient Active Problem List   Diagnosis Date Noted  . Palpitations 01/29/2017  . Diabetes mellitus type 2 in nonobese ([place]) 01/03/2016  . Well adult exam 12/21/2014  . Cystitis 09/05/2013  . Hot flash, menopausal 09/05/2013  . Breast cancer of upper-outer quadrant of right female breast ([place]) 07/11/2012  . Acute upper respiratory infection 03/18/2009  . TACHYCARDIA 03/18/2009  . THYROID FUNCTION TEST, ABNORMAL 03/18/2009  . DECREASED HEARING, RIGHT EAR 05/18/2008  . DIZZINESS 05/18/2008  . HYPERCHOLESTEROLEMIA 06/11/2006  . RAYNAUD'S DISEASE 06/11/2006  . OSTEOARTHRITIS 06/11/2006  . TACHYCARDIA, HX OF 06/11/2006    Past Surgical History:  Procedure Laterality Date  . ABDOMINAL HYSTERECTOMY  1998   b/l oopherectomt  . APPENDECTOMY    . BREAST LUMPECTOMY WITH NEEDLE LOCALIZATION AND AXILLARY SENTINEL LYMPH NODE BX Right 06/27/2012   Procedure:  BREAST LUMPECTOMY WITH NEEDLE LOCALIZATION AND AXILLARY SENTINEL LYMPH NODE BX remove skin lesion right breast ;  Surgeon: Joyice Faster. Cornett, MD;  Location: [facility];  Service: General;  Laterality: Right;  11:00 needle localization at BCG  1:00 nuclear medicine injection   . BREAST SURGERY  2010   Several lumectomies.lt  . COLONOSCOPY    . TONSILLECTOMY AND ADENOIDECTOMY       OB History   No obstetric history on file.      Home Medications    Prior to Admission medications   Medication Sig Start Date End Date Taking? Authorizing Provider  aspirin 81 MG tablet Take 81 mg by mouth daily.    [provider]  cholecalciferol (VITAMIN D) 1000 UNITS tablet Take 1,000 Units by mouth daily. d3    [provider]  Coenzyme Q10 (CO Q 10 PO) Take by mouth.    [provider]  cyanocobalamin 1000 MCG tablet Take 1,000 mcg by mouth daily.    [provider]  MAGNESIUM PO Take by mouth.    [provider]  metFORMIN (GLUCOPHAGE) 500 MG tablet Take 1 tablet (500 mg total) by mouth 2 (two) times daily with a meal. 01/31/17 01/31/18  Plotnikov, Evie Lacks, MD  Multiple Vitamins-Minerals (MULTIVITAMIN WITH MINERALS) tablet Take 1 tablet by mouth daily.    [provider]  tamoxifen (NOLVADEX) 20  MG tablet Take 1 tablet (20 mg total) by mouth daily. 01/24/18   Nicholas Lose, MD  TURMERIC PO Take by mouth.    [provider]  vitamin C (ASCORBIC ACID) 500 MG tablet Take 500 mg by mouth daily.    [provider]    Family History Family History  Problem Relation Age of Onset  . Cancer Mother        Breast  . Cancer Father        Colon  . Cancer Sister        Breast  . Heart attack Brother     Social History Social History   Tobacco Use  . Smoking status: Former Smoker    Types: Cigarettes    Last attempt to quit: 01/31/1971    Years since quitting: 47.0  . Smokeless tobacco: Never Used  Substance Use  Topics  . Alcohol use: No  . Drug use: No     Allergies   Latex; Codeine; Effexor [venlafaxine]; Penicillins; and Sulfonamide derivatives   Review of Systems Review of Systems  Musculoskeletal:       L hip pain   Skin: Positive for wound.  All other systems reviewed and are negative.    Physical Exam Updated Vital Signs BP (!) 153/78   Pulse 92   Temp 98.6 F (37 C) (Oral)   Resp 18   Ht 5\' 3"  (1.6 m)   Wt 56.7 kg   SpO2 99%   BMI 22.14 kg/m   Physical Exam Vitals signs and nursing note reviewed.  HENT:     Head: Normocephalic.     Nose:     Comments: 1 cm laceration bridge of nose, there is some nasal bone tenderness and deformity. No septal hematoma  Eyes:     Extraocular Movements: Extraocular movements intact.     Pupils: Pupils are equal, round, and reactive to light.  Neck:     Musculoskeletal: Normal range of motion.  Cardiovascular:     Rate and Rhythm: Normal rate.     Pulses: Normal pulses.  Pulmonary:     Effort: Pulmonary effort is normal.     Breath sounds: Normal breath sounds.  Abdominal:     General: Abdomen is flat.     Palpations: Abdomen is soft.  Musculoskeletal: Normal range of motion.     Comments: + L femur with hematoma on the lateral aspect. Nl ROM L hip, neurovascular intact bilateral lower extremities   Skin:    General: Skin is warm.     Capillary Refill: Capillary refill takes less than 2 seconds.  Neurological:     General: No focal deficit present.     Mental Status: She is alert and oriented to person, place, and time.  Psychiatric:        Mood and Affect: Mood normal.        Behavior: Behavior normal.      ED Treatments / Results  Labs (all labs ordered are listed, but only abnormal results are displayed) Labs Reviewed  CBC WITH DIFFERENTIAL/PLATELET  COMPREHENSIVE METABOLIC PANEL  PROTIME-INR    EKG None  Radiology No results found.  Procedures Procedures (including critical care time)  Medications  Ordered in ED Medications - No data to display   Initial Impression / Assessment and Plan / ED Course  I have reviewed the triage vital signs and the nursing notes.  Pertinent labs & imaging results that were available during my care of the patient were reviewed  by me and considered in my medical decision making (see chart for details).    RANIA PROTHERO is a 73 y.o. female here with fall with nasal laceration, L hip hematoma. Will get labs, CT head/face, L femur xrays.   7:23 PM CBC unremarkable. CT showed nasal bone fracture. Xray femur and pelvis unremarkable. I think likely hip hematoma. Will refer to ENT and expect that her face and leg will be black and blue for several days. Recommend ice, tylenol prn.    Final Clinical Impressions(s) / ED Diagnoses   Final diagnoses:  Fall    ED Discharge Orders    None       Drenda Freeze, MD 01/30/18 [phone]

## 2018-01-30 NOTE — Discharge Instructions (Signed)
Take tylenol for pain   Take vicodin for severe pain   Expect black and blue and worse swelling on your nose and thigh   See your doctor.   Consider following up with ENT   Return to ER if you have worse hip pain, trouble breathing, trouble walking

## 2018-01-31 ENCOUNTER — Encounter: Payer: Self-pay | Admitting: Internal Medicine

## 2018-01-31 ENCOUNTER — Ambulatory Visit (INDEPENDENT_AMBULATORY_CARE_PROVIDER_SITE_OTHER): Payer: Medicare Other | Admitting: Internal Medicine

## 2018-01-31 VITALS — BP 126/68 | HR 84 | Temp 98.3°F | Ht 63.0 in | Wt 128.0 lb

## 2018-01-31 DIAGNOSIS — R413 Other amnesia: Secondary | ICD-10-CM

## 2018-01-31 DIAGNOSIS — E785 Hyperlipidemia, unspecified: Secondary | ICD-10-CM

## 2018-01-31 DIAGNOSIS — R251 Tremor, unspecified: Secondary | ICD-10-CM | POA: Insufficient documentation

## 2018-01-31 DIAGNOSIS — S022XXD Fracture of nasal bones, subsequent encounter for fracture with routine healing: Secondary | ICD-10-CM

## 2018-01-31 DIAGNOSIS — S022XXA Fracture of nasal bones, initial encounter for closed fracture: Secondary | ICD-10-CM | POA: Insufficient documentation

## 2018-01-31 DIAGNOSIS — Z0000 Encounter for general adult medical examination without abnormal findings: Secondary | ICD-10-CM | POA: Diagnosis not present

## 2018-01-31 DIAGNOSIS — E119 Type 2 diabetes mellitus without complications: Secondary | ICD-10-CM

## 2018-01-31 LAB — POCT GLYCOSYLATED HEMOGLOBIN (HGB A1C): HEMOGLOBIN A1C: 6.4 % — AB (ref 4.0–5.6)

## 2018-01-31 NOTE — Assessment & Plan Note (Signed)
ENT if issues

## 2018-01-31 NOTE — Assessment & Plan Note (Signed)
Falls Neurol ref

## 2018-01-31 NOTE — Assessment & Plan Note (Signed)
Neurol ref 

## 2018-01-31 NOTE — Patient Instructions (Signed)

## 2018-01-31 NOTE — Addendum Note (Signed)
Addended by: Karren Cobble on: 01/31/2018 10:37 AM   Modules accepted: Orders

## 2018-01-31 NOTE — Assessment & Plan Note (Signed)
Here for medicare wellness/physical  Diet: heart healthy  Physical activity: not sedentary  Depression/mood screen: negative  Hearing: intact to whispered voice  Visual acuity: grossly normal w/glasses, performs annual eye exam  ADLs: capable  Fall risk: low to moderate Home safety: good  Cognitive evaluation: intact to orientation, naming, recall and repetition  EOL planning: adv directives, full code/ I agree  I have personally reviewed and have noted  1. The patient's medical, surgical and social history  2. Their use of alcohol, tobacco or illicit drugs  3. Their current medications and supplements  4. The patient's functional ability including ADL's, fall risks, home safety risks and hearing or visual impairment.  5. Diet and physical activities  6. Evidence for depression or mood disorders 7. The roster of all physicians providing medical care to patient - is listed in the Snapshot section of the chart and reviewed today.    Today patient counseled on age appropriate routine health concerns for screening and prevention, each reviewed and up to date or declined. Immunizations reviewed and up to date or declined. Labs ordered and reviewed. Risk factors for depression reviewed and negative. Hearing function and visual acuity are intact. ADLs screened and addressed as needed. Functional ability and level of safety reviewed and appropriate. Education, counseling and referrals performed based on assessed risks today. Patient provided with a copy of personalized plan for preventive services.   Cologuard info Declined shots and colonoscopy

## 2018-01-31 NOTE — Progress Notes (Signed)
Subjective:  Patient ID: Kathleen Harris, female    DOB: [birth year]  Age: 73 y.o. MRN: [id number]  CC: No chief complaint on file.   HPI Kathleen Harris presents for a well exam C/o a fall last night - ER visit reviewed C/o memory loss, poor balance x 5 years  Outpatient Medications Prior to Visit  Medication Sig Dispense Refill  . aspirin 81 MG tablet Take 81 mg by mouth daily.    . cholecalciferol (VITAMIN D) 1000 UNITS tablet Take 1,000 Units by mouth daily. d3    . Coenzyme Q10 (CO Q 10 PO) Take by mouth.    . cyanocobalamin 1000 MCG tablet Take 1,000 mcg by mouth daily.    Marland Kitchen HYDROcodone-acetaminophen (NORCO/VICODIN) 5-325 MG tablet Take 1 tablet by mouth every 6 (six) hours as needed. 8 tablet 0  . MAGNESIUM PO Take by mouth.    . metFORMIN (GLUCOPHAGE) 500 MG tablet Take 1 tablet (500 mg total) by mouth 2 (two) times daily with a meal. 180 tablet 3  . Multiple Vitamins-Minerals (MULTIVITAMIN WITH MINERALS) tablet Take 1 tablet by mouth daily.    . tamoxifen (NOLVADEX) 20 MG tablet Take 1 tablet (20 mg total) by mouth daily. 90 tablet 3  . TURMERIC PO Take by mouth.    . vitamin C (ASCORBIC ACID) 500 MG tablet Take 500 mg by mouth daily.     No facility-administered medications prior to visit.     ROS: Review of Systems  Constitutional: Negative for activity change, appetite change, chills, fatigue and unexpected weight change.  HENT: Negative for congestion, mouth sores and sinus pressure.   Eyes: Negative for visual disturbance.  Respiratory: Negative for cough and chest tightness.   Gastrointestinal: Negative for abdominal pain and nausea.  Genitourinary: Negative for difficulty urinating, frequency and vaginal pain.  Musculoskeletal: Positive for gait problem. Negative for back pain.  Skin: Negative for pallor and rash.  Neurological: Negative for dizziness, tremors, weakness, numbness and headaches.  Psychiatric/Behavioral: Negative for confusion, sleep disturbance  and suicidal ideas.    Objective:  BP 126/68 (BP Location: Left Arm, Patient Position: Sitting, Cuff Size: Normal)   Pulse 84   Temp 98.3 F (36.8 C) (Oral)   Ht 5\' 3"  (1.6 m)   Wt 128 lb (58.1 kg)   SpO2 99%   BMI 22.67 kg/m   BP Readings from Last 3 Encounters:  01/31/18 126/68  01/30/18 (!) 122/46  01/24/18 119/76    Wt Readings from Last 3 Encounters:  01/31/18 128 lb (58.1 kg)  01/30/18 125 lb (56.7 kg)  01/24/18 127 lb 9.6 oz (57.9 kg)    Physical Exam Constitutional:      General: She is not in acute distress.    Appearance: She is well-developed.  HENT:     Head: Normocephalic.     Right Ear: External ear normal.     Left Ear: External ear normal.     Nose: Nose normal.  Eyes:     General:        Right eye: No discharge.        Left eye: No discharge.     Conjunctiva/sclera: Conjunctivae normal.     Pupils: Pupils are equal, round, and reactive to light.  Neck:     Musculoskeletal: Normal range of motion and neck supple.     Thyroid: No thyromegaly.     Vascular: No JVD.     Trachea: No tracheal deviation.  Cardiovascular:  Rate and Rhythm: Normal rate and regular rhythm.     Heart sounds: Normal heart sounds.  Pulmonary:     Effort: No respiratory distress.     Breath sounds: No stridor. No wheezing.  Abdominal:     General: Bowel sounds are normal. There is no distension.     Palpations: Abdomen is soft. There is no mass.     Tenderness: There is no abdominal tenderness. There is no guarding or rebound.  Musculoskeletal:        General: No tenderness.  Lymphadenopathy:     Cervical: No cervical adenopathy.  Skin:    Findings: No erythema or rash.  Neurological:     Cranial Nerves: No cranial nerve deficit.     Motor: No abnormal muscle tone.     Coordination: Coordination normal.     Deep Tendon Reflexes: Reflexes normal.  Psychiatric:        Behavior: Behavior normal.        Thought Content: Thought content normal.        Judgment:  Judgment normal.   facial bruises B nose - tender L hip hematoma Tremor A/o/c Steady Prominent vein - L arm - not new  Lab Results  Component Value Date   WBC 12.5 (H) 01/30/2018   HGB 12.5 01/30/2018   HCT 37.8 01/30/2018   PLT 235 01/30/2018   GLUCOSE 188 (H) 01/30/2018   CHOL 164 04/27/2017   TRIG 105.0 04/27/2017   HDL 50.60 04/27/2017   LDLDIRECT 139.4 02/16/2006   LDLCALC 92 04/27/2017   ALT 13 01/30/2018   AST 22 01/30/2018   NA 138 01/30/2018   K 3.8 01/30/2018   CL 103 01/30/2018   CREATININE 0.75 01/30/2018   BUN 10 01/30/2018   CO2 25 01/30/2018   TSH 1.98 04/27/2017   INR 1.01 01/30/2018   HGBA1C 7.4 (H) 04/27/2017   MICROALBUR 1.6 04/27/2017    Dg Pelvis 1-2 Views  Result Date: 01/30/2018 CLINICAL DATA:  Fall with pain EXAM: PELVIS - 1-2 VIEW COMPARISON:  None. FINDINGS: SI joints are non widened. Pubic symphysis and rami are intact. No fracture or malalignment. IMPRESSION: No acute osseous abnormality. Electronically Signed   By: Donavan Foil M.D.   On: 01/30/2018 18:53   Ct Head Wo Contrast  Result Date: 01/30/2018 CLINICAL DATA:  [place] and hit face on floor with laceration to the nose EXAM: CT HEAD WITHOUT CONTRAST CT MAXILLOFACIAL WITHOUT CONTRAST TECHNIQUE: Multidetector CT imaging of the head and maxillofacial structures were performed using the standard protocol without intravenous contrast. Multiplanar CT image reconstructions of the maxillofacial structures were also generated. COMPARISON:  None. FINDINGS: CT HEAD FINDINGS Brain: No acute territorial infarction, hemorrhage, or intracranial mass. The ventricles are nonenlarged. Minimal small vessel ischemic change of the white matter. Mild to moderate atrophy. Vascular: No hyperdense vessels. Carotid vascular calcification Skull: No fracture Other: Soft tissue swelling over the nasal region CT MAXILLOFACIAL FINDINGS Osseous: Acute, mildly displaced and depressed nasal bone fracture involving the tip of the  nasal bones. Normal positioning of mandibular heads. No mandibular fracture. Zygomatic arches and pterygoid plates are intact. Orbits: Negative. No traumatic or inflammatory finding. Sinuses: Opacified right maxillary sinus. Mucosal thickening in the left maxillary sinus and ethmoid sinuses Soft tissues: Soft tissue swelling over the nasal area IMPRESSION: 1. No CT evidence for acute intracranial abnormality. Atrophy 2. Acute mildly displaced nasal bone fractures. Electronically Signed   By: Donavan Foil M.D.   On: 01/30/2018 18:51   Dg  Femur Min 2 Views Left  Result Date: 01/30/2018 CLINICAL DATA:  Mechanical fall, left hip pain EXAM: LEFT FEMUR 2 VIEWS COMPARISON:  None. FINDINGS: Pubic symphysis and left rami are intact. The left SI joint is not widened. No fracture or malalignment. Soft tissues are unremarkable IMPRESSION: No acute osseous abnormality Electronically Signed   By: Donavan Foil M.D.   On: 01/30/2018 18:53   Ct Maxillofacial Wo Contrast  Result Date: 01/30/2018 CLINICAL DATA:  [place] and hit face on floor with laceration to the nose EXAM: CT HEAD WITHOUT CONTRAST CT MAXILLOFACIAL WITHOUT CONTRAST TECHNIQUE: Multidetector CT imaging of the head and maxillofacial structures were performed using the standard protocol without intravenous contrast. Multiplanar CT image reconstructions of the maxillofacial structures were also generated. COMPARISON:  None. FINDINGS: CT HEAD FINDINGS Brain: No acute territorial infarction, hemorrhage, or intracranial mass. The ventricles are nonenlarged. Minimal small vessel ischemic change of the white matter. Mild to moderate atrophy. Vascular: No hyperdense vessels.  Carotid vascular calcification Skull: No fracture Other: Soft tissue swelling over the nasal region CT MAXILLOFACIAL FINDINGS Osseous: Acute, mildly displaced and depressed nasal bone fracture involving the tip of the nasal bones. Normal positioning of mandibular heads. No mandibular fracture.  Zygomatic arches and pterygoid plates are intact. Orbits: Negative. No traumatic or inflammatory finding. Sinuses: Opacified right maxillary sinus. Mucosal thickening in the left maxillary sinus and ethmoid sinuses Soft tissues: Soft tissue swelling over the nasal area IMPRESSION: 1. No CT evidence for acute intracranial abnormality.  Atrophy 2. Acute mildly displaced nasal bone fractures. Electronically Signed   By: Donavan Foil M.D.   On: 01/30/2018 18:50    Assessment & Plan:   There are no diagnoses linked to this encounter.   No orders of the defined types were placed in this encounter.    Follow-up: No follow-ups on file.  Walker Kehr, MD

## 2018-01-31 NOTE — Assessment & Plan Note (Signed)
A1c  Pt refused other labs CT ca scoring

## 2018-02-10 ENCOUNTER — Other Ambulatory Visit: Payer: Self-pay | Admitting: Hematology and Oncology

## 2018-02-10 DIAGNOSIS — C50412 Malignant neoplasm of upper-outer quadrant of left female breast: Secondary | ICD-10-CM

## 2018-02-10 DIAGNOSIS — Z170 Estrogen receptor positive status [ER+]: Principal | ICD-10-CM

## 2018-02-12 ENCOUNTER — Other Ambulatory Visit: Payer: Self-pay | Admitting: Hematology and Oncology

## 2018-02-12 DIAGNOSIS — C50412 Malignant neoplasm of upper-outer quadrant of left female breast: Secondary | ICD-10-CM

## 2018-02-12 DIAGNOSIS — Z170 Estrogen receptor positive status [ER+]: Principal | ICD-10-CM

## 2018-02-22 ENCOUNTER — Ambulatory Visit (INDEPENDENT_AMBULATORY_CARE_PROVIDER_SITE_OTHER)
Admission: RE | Admit: 2018-02-22 | Discharge: 2018-02-22 | Disposition: A | Discharge status: Home / Self Care | Payer: Self-pay | Source: Ambulatory Visit | Attending: Internal Medicine | Admitting: Internal Medicine

## 2018-02-22 DIAGNOSIS — E785 Hyperlipidemia, unspecified: Secondary | ICD-10-CM

## 2018-04-07 ENCOUNTER — Other Ambulatory Visit: Payer: Self-pay | Admitting: Internal Medicine

## 2018-04-16 ENCOUNTER — Other Ambulatory Visit: Payer: Self-pay | Admitting: Internal Medicine

## 2018-04-16 DIAGNOSIS — Z1231 Encounter for screening mammogram for malignant neoplasm of breast: Secondary | ICD-10-CM

## 2018-04-30 ENCOUNTER — Telehealth: Payer: Self-pay | Admitting: Internal Medicine

## 2018-04-30 NOTE — Telephone Encounter (Signed)
Pt left a vm requesting someone to contact her pharmacy about her Metformin. Per pt she has not been able to obtain a refill but according to epic this was called in on 04/09/18 for qty 180 rf #3

## 2018-05-02 ENCOUNTER — Other Ambulatory Visit: Payer: Self-pay

## 2018-05-02 MED ORDER — METFORMIN HCL 500 MG PO TABS: ORAL_TABLET | ORAL | 3 refills | Status: DC

## 2018-05-02 NOTE — Telephone Encounter (Signed)
RX resent

## 2018-05-30 ENCOUNTER — Ambulatory Visit: Payer: Medicare Other

## 2018-06-07 ENCOUNTER — Other Ambulatory Visit: Payer: Self-pay | Admitting: Hematology and Oncology

## 2018-06-07 DIAGNOSIS — C50412 Malignant neoplasm of upper-outer quadrant of left female breast: Secondary | ICD-10-CM

## 2018-07-08 ENCOUNTER — Other Ambulatory Visit: Payer: Self-pay

## 2018-07-08 ENCOUNTER — Ambulatory Visit
Admission: RE | Admit: 2018-07-08 | Discharge: 2018-07-08 | Disposition: A | Discharge status: Home / Self Care | Payer: Medicare Other | Source: Ambulatory Visit | Attending: Internal Medicine | Admitting: Internal Medicine

## 2018-07-08 DIAGNOSIS — Z1231 Encounter for screening mammogram for malignant neoplasm of breast: Secondary | ICD-10-CM

## 2018-08-05 ENCOUNTER — Other Ambulatory Visit: Payer: Self-pay | Admitting: Hematology and Oncology

## 2018-08-05 DIAGNOSIS — C50412 Malignant neoplasm of upper-outer quadrant of left female breast: Secondary | ICD-10-CM

## 2018-11-11 ENCOUNTER — Other Ambulatory Visit: Payer: Self-pay | Admitting: Hematology and Oncology

## 2018-11-11 DIAGNOSIS — C50412 Malignant neoplasm of upper-outer quadrant of left female breast: Secondary | ICD-10-CM

## 2018-11-11 DIAGNOSIS — Z170 Estrogen receptor positive status [ER+]: Secondary | ICD-10-CM

## 2018-11-15 ENCOUNTER — Other Ambulatory Visit: Payer: Self-pay | Admitting: Hematology and Oncology

## 2018-11-15 DIAGNOSIS — Z170 Estrogen receptor positive status [ER+]: Secondary | ICD-10-CM

## 2018-11-15 DIAGNOSIS — C50412 Malignant neoplasm of upper-outer quadrant of left female breast: Secondary | ICD-10-CM

## 2019-01-26 NOTE — Progress Notes (Signed)
Patient Care Team: Plotnikov, Evie Lacks, MD as PCP - General (Internal Medicine)  DIAGNOSIS:    ICD-10-CM   1. 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 with above-mentioned history of right breast cancer treated with lumpectomy, radiation, and who is currently on tamoxifen. Mammogram on 07/08/18 showed no evidence of malignancy bilaterally. She presents to the clinic today for annual follow-up.   REVIEW OF SYSTEMS:   Constitutional: Denies fevers, chills or abnormal weight loss Eyes: Denies blurriness of vision Ears, nose, mouth, throat, and face: Denies mucositis or sore throat Respiratory: Denies cough, dyspnea or wheezes Cardiovascular: Denies palpitation, chest discomfort Gastrointestinal: Denies nausea, heartburn or change in bowel habits Skin: Denies abnormal skin rashes Lymphatics: Denies new lymphadenopathy or easy bruising Neurological: Denies numbness, tingling or new weaknesses Behavioral/Psych: Mood is stable, no new changes  Extremities: No lower extremity edema Breast: denies any pain or lumps or nodules in either breasts All other systems were reviewed with  the patient and are negative.  I have reviewed the past medical history, past surgical history, social history and family history with the patient and they are unchanged from previous note.  ALLERGIES:  is allergic to latex; codeine; effexor [venlafaxine]; penicillins; and sulfonamide derivatives.  MEDICATIONS:  Current Outpatient Medications  Medication Sig Dispense Refill  . aspirin 81 MG tablet Take 81 mg by mouth daily.    . cholecalciferol (VITAMIN D) 1000 UNITS tablet Take 1,000 Units by mouth daily. d3    . Coenzyme Q10 (CO Q 10 PO) Take by mouth.    . cyanocobalamin 1000 MCG tablet Take 1,000 mcg by mouth daily.    Marland Kitchen HYDROcodone-acetaminophen (NORCO/VICODIN) 5-325 MG tablet Take 1 tablet by mouth every 6 (six) hours as needed. 8 tablet 0  . MAGNESIUM PO Take by mouth.    . metFORMIN (GLUCOPHAGE) 500 MG tablet TAKE 1 TABLET BY MOUTH TWICE DAILY WITH A  MEAL 180 tablet 3  . Multiple Vitamins-Minerals (MULTIVITAMIN WITH MINERALS) tablet Take 1 tablet by mouth daily.    . tamoxifen (NOLVADEX) 20 MG tablet Take 1 tablet by mouth once daily 90 tablet 0  . TURMERIC PO Take by mouth.    . vitamin C (ASCORBIC ACID) 500 MG tablet Take 500 mg by mouth daily.     No current facility-administered medications for this visit.    PHYSICAL EXAMINATION: ECOG PERFORMANCE STATUS: 1 - Symptomatic but completely ambulatory  Vitals:   01/27/19 0840  BP: 134/65  Pulse: 92  Resp: 18  Temp: 98 F (36.7 C)  SpO2: 100%   Filed Weights   01/27/19 0840  Weight: 131 lb 4.8  oz (59.6 kg)    GENERAL: alert, no distress and comfortable SKIN: skin color, texture, turgor are normal, no rashes or significant lesions EYES: normal, Conjunctiva are pink and non-injected, sclera clear OROPHARYNX: no exudate, no erythema and lips, buccal mucosa, and tongue normal  NECK: supple, thyroid normal size, non-tender, without nodularity LYMPH: no palpable lymphadenopathy in the cervical, axillary or  inguinal LUNGS: clear to auscultation and percussion with normal breathing effort HEART: regular rate & rhythm and no murmurs and no lower extremity edema ABDOMEN: abdomen soft, non-tender and normal bowel sounds MUSCULOSKELETAL: no cyanosis of digits and no clubbing  NEURO: alert & oriented x 3 with fluent speech, no focal motor/sensory deficits EXTREMITIES: No lower extremity edema   LABORATORY DATA:  I have reviewed the data as listed CMP Latest Ref Rng & Units 01/30/2018 04/27/2017 01/03/2016  Glucose 70 - 99 mg/dL 188(H) 199(H) 168(H)  BUN 8 - 23 mg/dL '10 16 12  '$ Creatinine 0.44 - 1.00 mg/dL 0.75 0.62 0.60  Sodium 135 - 145 mmol/L 138 139 140  Potassium 3.5 - 5.1 mmol/L 3.8 3.9 4.1  Chloride 98 - 111 mmol/L 103 102 102  CO2 22 - 32 mmol/L '25 29 29  '$ Calcium 8.9 - 10.3 mg/dL 10.2 9.8 10.4  Total Protein 6.5 - 8.1 g/dL 5.8(L) 6.4 6.7  Total Bilirubin 0.3 - 1.2 mg/dL 0.4 0.5 0.6  Alkaline Phos 38 - 126 U/L 39 55 53  AST 15 - 41 U/L '22 16 18  '$ ALT 0 - 44 U/L '13 14 17    '$ Lab Results  Component Value Date   WBC 12.5 (H) 01/30/2018   HGB 12.5 01/30/2018   HCT 37.8 01/30/2018   MCV 93.6 01/30/2018   PLT 235 01/30/2018   NEUTROABS 8.7 (H) 01/30/2018    ASSESSMENT & PLAN:  Breast cancer of upper-outer quadrant of right female breast ([place]) 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 Breast exam done by her gynecologist and at the breast center were normal May 2019, patient refused breast exam today. 2. Mammograms 6/8/2020normal, breast density category B  Tremors and intermittent ataxia: I discussed with her that this is not Parkinson's because it is not resting tremor.  Dr. Alain Marion is addressing  this.  Patient has tenderness in the breast and would like to get a diagnostic mammogram every year.  I will send the order for that.  This will need to be ordered every year as a diagnostic mammogram.  Return to clinic in 1 year for follow-up    No orders of the defined types were placed in this encounter.  The patient has a good understanding of the overall plan. she agrees with it. she will call with any problems that may develop before the next visit here.  Nicholas Lose, MD 01/27/2019  Julious Oka Dorshimer, am acting as scribe for Dr. Nicholas Lose.  I have reviewed the above document for accuracy and completeness, and I agree with the above.

## 2019-01-27 ENCOUNTER — Telehealth: Payer: Self-pay | Admitting: Hematology and Oncology

## 2019-01-27 ENCOUNTER — Other Ambulatory Visit: Payer: Self-pay

## 2019-01-27 ENCOUNTER — Inpatient Hospital Stay: Payer: Medicare Other | Attending: Hematology and Oncology | Admitting: Hematology and Oncology

## 2019-01-27 DIAGNOSIS — C50411 Malignant neoplasm of upper-outer quadrant of right female breast: Secondary | ICD-10-CM | POA: Insufficient documentation

## 2019-01-27 DIAGNOSIS — C50412 Malignant neoplasm of upper-outer quadrant of left female breast: Secondary | ICD-10-CM | POA: Diagnosis not present

## 2019-01-27 DIAGNOSIS — Z79810 Long term (current) use of selective estrogen receptor modulators (SERMs): Secondary | ICD-10-CM | POA: Insufficient documentation

## 2019-01-27 DIAGNOSIS — Z170 Estrogen receptor positive status [ER+]: Secondary | ICD-10-CM

## 2019-01-27 DIAGNOSIS — Z923 Personal history of irradiation: Secondary | ICD-10-CM | POA: Insufficient documentation

## 2019-01-27 MED ORDER — TAMOXIFEN CITRATE 20 MG PO TABS: 20.0000 mg | ORAL_TABLET | Freq: Every day | ORAL | 4 refills | Status: DC

## 2019-01-27 MED ORDER — KRILL OIL 500 MG PO CAPS: 500.0000 mg | ORAL_CAPSULE | Freq: Every day | ORAL | Status: DC

## 2019-01-27 MED ORDER — B COMPLEX PO TABS: 1.0000 | ORAL_TABLET | Freq: Every day | ORAL | Status: DC

## 2019-01-27 MED ORDER — GRAPE SEED 30 MG PO CAPS: 30.0000 mg | ORAL_CAPSULE | Freq: Every day | ORAL | 0 refills | Status: DC

## 2019-01-27 NOTE — Assessment & Plan Note (Signed)
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 Breast exam done by her gynecologist and at the breast center were normal May 2019, patient refused breast exam today. 2. Mammograms 6/8/2020normal, breast density category B  Tremors and intermittent ataxia: Patient is very concerned about Parkinson's disease.  I will make a referral to neurology for further evaluation.  Return to clinic in 1 year for follow-up

## 2019-01-27 NOTE — Telephone Encounter (Signed)
Scheduled per 12/28 los, voicemail not set up. Calender will be mailed.

## 2019-02-04 ENCOUNTER — Encounter: Payer: Medicare Other | Admitting: Internal Medicine

## 2019-07-09 ENCOUNTER — Other Ambulatory Visit: Payer: Self-pay

## 2019-07-09 ENCOUNTER — Other Ambulatory Visit: Payer: Self-pay | Admitting: Hematology and Oncology

## 2019-07-09 ENCOUNTER — Ambulatory Visit
Admission: RE | Admit: 2019-07-09 | Discharge: 2019-07-09 | Disposition: A | Discharge status: Home / Self Care | Payer: Medicare PPO | Source: Ambulatory Visit | Attending: Hematology and Oncology | Admitting: Hematology and Oncology

## 2019-07-09 DIAGNOSIS — N6489 Other specified disorders of breast: Secondary | ICD-10-CM | POA: Diagnosis not present

## 2019-07-09 DIAGNOSIS — C50412 Malignant neoplasm of upper-outer quadrant of left female breast: Secondary | ICD-10-CM

## 2019-07-09 DIAGNOSIS — Z170 Estrogen receptor positive status [ER+]: Secondary | ICD-10-CM

## 2019-07-09 DIAGNOSIS — C50411 Malignant neoplasm of upper-outer quadrant of right female breast: Secondary | ICD-10-CM

## 2019-07-09 DIAGNOSIS — N6310 Unspecified lump in the right breast, unspecified quadrant: Secondary | ICD-10-CM

## 2019-07-09 DIAGNOSIS — Z853 Personal history of malignant neoplasm of breast: Secondary | ICD-10-CM | POA: Diagnosis not present

## 2019-07-09 DIAGNOSIS — R928 Other abnormal and inconclusive findings on diagnostic imaging of breast: Secondary | ICD-10-CM | POA: Diagnosis not present

## 2019-07-14 ENCOUNTER — Other Ambulatory Visit: Payer: Self-pay

## 2019-07-14 ENCOUNTER — Ambulatory Visit
Admission: RE | Admit: 2019-07-14 | Discharge: 2019-07-14 | Disposition: A | Discharge status: Home / Self Care | Payer: Medicare PPO | Source: Ambulatory Visit | Attending: Hematology and Oncology | Admitting: Hematology and Oncology

## 2019-07-14 DIAGNOSIS — N6312 Unspecified lump in the right breast, upper inner quadrant: Secondary | ICD-10-CM | POA: Diagnosis not present

## 2019-07-14 DIAGNOSIS — N6310 Unspecified lump in the right breast, unspecified quadrant: Secondary | ICD-10-CM

## 2019-07-14 DIAGNOSIS — N6011 Diffuse cystic mastopathy of right breast: Secondary | ICD-10-CM | POA: Diagnosis not present

## 2019-07-15 ENCOUNTER — Telehealth: Payer: Self-pay | Admitting: *Deleted

## 2019-07-15 ENCOUNTER — Other Ambulatory Visit: Payer: Self-pay | Admitting: Hematology and Oncology

## 2019-07-15 DIAGNOSIS — Z853 Personal history of malignant neoplasm of breast: Secondary | ICD-10-CM

## 2019-07-15 NOTE — Telephone Encounter (Signed)
Received call from pt stating the [facility] is wanting to schedule a follow up US of her right axilla in 4 weeks.  Pt states the [place] she had preformed on 07/09/19 showed enlarged lymph nodes in her right axilla which could possibly be related to her recent covid 19 vaccine.  Pt stated she does not want to return for a follow up US because she believes it is related to the covid 19 vaccine.  RN educated pt on importance of a follow up US to monitor the lymph nodes.  Pt strongly disagreed and stated she refuses to have the [place] preformed.  RN educated pt if she were to change her mind to let our office know.  Pt verbalized understanding.

## 2019-08-20 ENCOUNTER — Other Ambulatory Visit: Payer: Medicare PPO

## 2019-12-02 ENCOUNTER — Telehealth: Payer: Self-pay | Admitting: Internal Medicine

## 2019-12-02 NOTE — Telephone Encounter (Signed)
Patient is requesting a medication for nausea. She said she is fine laying down but when she is up and moving she is dizzy and nauseated. The rx can be sent to [place], [place]  Please call the patient if rx is sent in [phone].

## 2019-12-02 NOTE — Telephone Encounter (Signed)
Patient called and said that her blood sugar was 288 and she was dizzy and nauseated. She does have an appointment with Jodi Mourning, FNP on 11.3.2021. But the patients daughter was concerned and wanted to speak to a nurse. Transferred to [employer] at [employer].

## 2019-12-03 ENCOUNTER — Ambulatory Visit (INDEPENDENT_AMBULATORY_CARE_PROVIDER_SITE_OTHER): Payer: Medicare PPO | Admitting: Family

## 2019-12-03 ENCOUNTER — Encounter: Payer: Self-pay | Admitting: Family

## 2019-12-03 ENCOUNTER — Other Ambulatory Visit: Payer: Self-pay

## 2019-12-03 VITALS — BP 110/68 | HR 105 | Temp 98.1°F | Wt 126.0 lb

## 2019-12-03 DIAGNOSIS — E119 Type 2 diabetes mellitus without complications: Secondary | ICD-10-CM | POA: Diagnosis not present

## 2019-12-03 DIAGNOSIS — R300 Dysuria: Secondary | ICD-10-CM | POA: Diagnosis not present

## 2019-12-03 DIAGNOSIS — R42 Dizziness and giddiness: Secondary | ICD-10-CM

## 2019-12-03 LAB — POCT GLYCOSYLATED HEMOGLOBIN (HGB A1C): Hemoglobin A1C: 7.5 % — AB (ref 4.0–5.6)

## 2019-12-03 LAB — GLUCOSE, POCT (MANUAL RESULT ENTRY): POC Glucose: 212 mg/dl — AB (ref 70–99)

## 2019-12-03 MED ORDER — MECLIZINE HCL 12.5 MG PO TABS: 12.5000 mg | ORAL_TABLET | Freq: Three times a day (TID) | ORAL | 1 refills | Status: DC | PRN

## 2019-12-03 NOTE — Progress Notes (Signed)
Kathleen Harris is a 74 y.o. female with the following history as recorded in EpicCare:  Patient Active Problem List   Diagnosis Date Noted  . Tremor 01/31/2018  . Memory problem 01/31/2018  . Nose fracture 01/31/2018  . Palpitations 01/29/2017  . Diabetes mellitus type 2 in nonobese ([place]) 01/03/2016  . Well adult exam 12/21/2014  . Cystitis 09/05/2013  . Hot flash, menopausal 09/05/2013  . Breast cancer of upper-outer quadrant of right female breast ([place]) 07/11/2012  . Acute upper respiratory infection 03/18/2009  . TACHYCARDIA 03/18/2009  . THYROID FUNCTION TEST, ABNORMAL 03/18/2009  . DECREASED HEARING, RIGHT EAR 05/18/2008  . DIZZINESS 05/18/2008  . HYPERCHOLESTEROLEMIA 06/11/2006  . RAYNAUD'S DISEASE 06/11/2006  . OSTEOARTHRITIS 06/11/2006  . TACHYCARDIA, HX OF 06/11/2006    Current Outpatient Medications  Medication Sig Dispense Refill  . aspirin 81 MG tablet Take 81 mg by mouth daily.    Marland Kitchen b complex vitamins tablet Take 1 tablet by mouth daily.    . cholecalciferol (VITAMIN D) 1000 UNITS tablet Take 1,000 Units by mouth daily. d3    . Coenzyme Q10 (CO Q 10 PO) Take by mouth.    . Grape Seed 30 MG CAPS Take 1 capsule (30 mg total) by mouth daily.  0  . Krill Oil 500 MG CAPS Take 1 capsule (500 mg total) by mouth daily.    Marland Kitchen MAGNESIUM PO Take by mouth.    . meclizine (ANTIVERT) 12.5 MG tablet Take 1-2 tablets (12.5-25 mg total) by mouth 3 (three) times daily as needed for dizziness or nausea. 60 tablet 1  . metFORMIN (GLUCOPHAGE) 500 MG tablet TAKE 1 TABLET BY MOUTH TWICE DAILY WITH A  MEAL 180 tablet 3  . tamoxifen (NOLVADEX) 20 MG tablet Take 1 tablet (20 mg total) by mouth daily. 90 tablet 4  . TURMERIC PO Take by mouth.    . vitamin C (ASCORBIC ACID) 500 MG tablet Take 500 mg by mouth daily.     No current facility-administered medications for this visit.    Allergies: Latex, Codeine, Effexor [venlafaxine], Penicillins, and Sulfonamide derivatives  Past  Medical History:  Diagnosis Date  . Allergy   . Arthritis    fingers  . Asthma    as a child last attack teenager  . Breast cancer [facility]) 2014   Right Breast Cancer  . Hot flashes   . Hx of radiation therapy 08/05/12-08/26/12   right breast 4256 cGy 16 sessions  . Irregular heart beat   . Medical history non-contributory   . Personal history of radiation therapy 2014   Right Breast Cancer  . Wears glasses     Past Surgical History:  Procedure Laterality Date  . ABDOMINAL HYSTERECTOMY  1998   b/l oopherectomt  . APPENDECTOMY    . BREAST EXCISIONAL BIOPSY Left    benign  . BREAST EXCISIONAL BIOPSY Left    benign  . BREAST EXCISIONAL BIOPSY Left    benign  . BREAST LUMPECTOMY Right 2014  . BREAST LUMPECTOMY WITH NEEDLE LOCALIZATION AND AXILLARY SENTINEL LYMPH NODE BX Right 06/27/2012   Procedure: BREAST LUMPECTOMY WITH NEEDLE LOCALIZATION AND AXILLARY SENTINEL LYMPH NODE BX remove skin lesion right breast ;  Surgeon: Joyice Faster. Cornett, MD;  Location: [facility];  Service: General;  Laterality: Right;  11:00 needle localization at BCG  1:00 nuclear medicine injection   . BREAST SURGERY  2010   Several lumectomies.lt  . COLONOSCOPY    . TONSILLECTOMY AND ADENOIDECTOMY  Family History  Problem Relation Age of Onset  . Cancer Mother        Breast  . Breast cancer Mother 32  . Cancer Father        Colon  . Cancer Sister        Breast  . Breast cancer Sister   . Heart attack Brother     Social History   Tobacco Use  . Smoking status: Former Smoker    Types: Cigarettes    Quit date: 01/31/1971    Years since quitting: 48.8  . Smokeless tobacco: Never Used  Substance Use Topics  . Alcohol use: No    Subjective:   2 day history of dizziness; symptoms are most noticeable when moving his head/ changing position; mild nausea; EMS came to her home yesterday and evaluated- did not recommend going to ER; she was concerned because her blood sugar was 288  yesterday- would like it re-checked today;   Also asking to get her urine checked today- she self-medicated with left over Cipro; she felt that her urine was not as clear as normal recently;    Objective:  Vitals:   12/03/19 1147 12/03/19 1340  BP: 118/64 110/68  Pulse: (!) 105   Temp: 98.1 F (36.7 C)   TempSrc: Oral   SpO2: 98%   Weight: 126 lb (57.2 kg)     General: Well developed, well nourished, in no acute distress  Skin : Warm and dry.  Head: Normocephalic and atraumatic  Eyes: Sclera and conjunctiva clear; pupils round and reactive to light; extraocular movements intact  Ears: External normal; canals clear; tympanic membranes congested Oropharynx: Pink, supple. No suspicious lesions  Neck: Supple without thyromegaly, adenopathy  Lungs: Respirations unlabored; clear to auscultation bilaterally without wheeze, rales, rhonchi  CVS exam: normal rate and regular rhythm.  Abdomen: Soft; nontender; nondistended; normoactive bowel sounds; no masses or hepatosplenomegaly  Musculoskeletal: No deformities; no active joint inflammation  Extremities: No edema, cyanosis, clubbing  Vessels: Symmetric bilaterally  Neurologic: Alert and oriented; speech intact; face symmetrical; moves all extremities well; CNII-XII intact without focal deficit   Assessment:  1. Vertigo   2. Dysuria   3. Diabetes mellitus type 2 in nonobese [facility])     Plan:  1. Rx for Antivert was called in for this morning; she agrees and will take as directed; follow up worse, no better; 2. U/A not remarkable; patient defers urine culture; 3. BS in office 212 and hgba1c at 7.5; she defers medications; follow-up with her PCP as needed.  Time spent 30 minutes  This visit occurred during the SARS-CoV-2 public health emergency.  Safety protocols were in place, including screening questions prior to the visit, additional usage of staff PPE, and extensive cleaning of exam room while observing appropriate contact time as  indicated for disinfecting solutions.     No follow-ups on file.  Orders Placed This Encounter  Procedures  . POCT Glucose (CBG)  . POCT glycosylated hemoglobin (Hb A1C)    Requested Prescriptions    No prescriptions requested or ordered in this encounter

## 2019-12-03 NOTE — Telephone Encounter (Signed)
Patient has an appointment with NP for 11.3.2021 at 11:20am.    states she is refusing to refuse to go to ED, bs 288 dizzy and nausea, has appt /tomorrow. Ems recommended go to ER. nurse. BS was checked per EMS, patient refuses diabetic medication and does not monitor it at home. States she feels like the room is spinning.

## 2019-12-03 NOTE — Telephone Encounter (Signed)
Okay meclizine.  Office visit if not well.  Thanks

## 2019-12-03 NOTE — Telephone Encounter (Signed)
Tried calling there was no answer and no vm. Per chart pt has appt w/[name] today @ 11:20. Will inform her about medication being sent.Kathleen KitchenJohny Chess

## 2019-12-04 ENCOUNTER — Telehealth: Payer: Self-pay | Admitting: Internal Medicine

## 2019-12-04 ENCOUNTER — Telehealth: Payer: Self-pay

## 2019-12-04 NOTE — Telephone Encounter (Signed)
Patient saw Mickel Baas yesterday and thought Mickel Baas had told her she was going to send in medication for her ears, and was looking over the AVS and called her pharmacy and saw nothing was sent in and wanted to follow up on that. Patient would like to be called and explained about what the decision was on sending medication in.  Patient # [phone]

## 2019-12-04 NOTE — Telephone Encounter (Signed)
Re-explained what was explained during the office visit on 11/3.

## 2019-12-04 NOTE — Telephone Encounter (Signed)
As I explained to her at her [facility] yesterday, Dr. Alain Marion had called in the [place] (Meclizine) about an hour prior to her appointment. That is why it didn't show up on her AVS as a prescription sent in at the time of her OV. She was instructed where to pick it up. We reviewed that she did indeed want it at the [facility]  on [address].  If her symptoms persist after taking the medication, she needs to see Dr. Alain Marion.

## 2019-12-16 ENCOUNTER — Ambulatory Visit (INDEPENDENT_AMBULATORY_CARE_PROVIDER_SITE_OTHER): Payer: Medicare PPO | Admitting: Internal Medicine

## 2019-12-16 ENCOUNTER — Other Ambulatory Visit: Payer: Self-pay

## 2019-12-16 ENCOUNTER — Encounter: Payer: Self-pay | Admitting: Internal Medicine

## 2019-12-16 VITALS — BP 122/60 | HR 85 | Temp 98.8°F | Wt 126.2 lb

## 2019-12-16 DIAGNOSIS — R42 Dizziness and giddiness: Secondary | ICD-10-CM | POA: Diagnosis not present

## 2019-12-16 DIAGNOSIS — E119 Type 2 diabetes mellitus without complications: Secondary | ICD-10-CM | POA: Diagnosis not present

## 2019-12-16 DIAGNOSIS — F419 Anxiety disorder, unspecified: Secondary | ICD-10-CM | POA: Diagnosis not present

## 2019-12-16 DIAGNOSIS — E785 Hyperlipidemia, unspecified: Secondary | ICD-10-CM | POA: Diagnosis not present

## 2019-12-16 MED ORDER — METFORMIN HCL 500 MG PO TABS
ORAL_TABLET | ORAL | 3 refills | Status: DC
Start: 2019-12-16 — End: 2019-12-16

## 2019-12-16 MED ORDER — METFORMIN HCL 500 MG PO TABS
ORAL_TABLET | ORAL | 3 refills | Status: DC
Start: 2019-12-16 — End: 2021-05-25

## 2019-12-16 MED ORDER — DIAZEPAM 5 MG PO TABS
5.0000 mg | ORAL_TABLET | Freq: Two times a day (BID) | ORAL | 1 refills | Status: AC | PRN
Start: 2019-12-16 — End: ?

## 2019-12-16 MED ORDER — VITAMIN D3 50 MCG (2000 UT) PO CAPS: 2000.0000 [IU] | ORAL_CAPSULE | Freq: Every day | ORAL | 3 refills | Status: AC

## 2019-12-16 NOTE — Patient Instructions (Signed)
Benign Positional Vertigo symptoms   Meclizine. Start Brandt - Daroff exercise several times a day as dirrected.  

## 2019-12-16 NOTE — Progress Notes (Signed)
Subjective:  Patient ID: Kathleen Harris, female    DOB: 07-22-1945  Age: 74 y.o. 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 aspirin 81 MG tablet Take 81 mg by mouth daily.    Marland Kitchen b complex vitamins tablet Take 1 tablet by mouth daily.    . cholecalciferol (VITAMIN D) 1000 UNITS tablet Take 1,000 Units by mouth daily. d3    . Coenzyme Q10 (CO Q 10 PO) Take by mouth.    . Grape Seed 30 MG CAPS Take 1 capsule (30 mg total) by mouth daily.  0  . Krill Oil 500 MG CAPS Take 1 capsule (500 mg total) by mouth daily.    Marland Kitchen MAGNESIUM PO Take by mouth.    . metFORMIN (GLUCOPHAGE) 500 MG tablet TAKE 1 TABLET BY MOUTH TWICE DAILY WITH A  MEAL 180 tablet 3  . tamoxifen (NOLVADEX) 20 MG tablet Take 1 tablet (20 mg total) by mouth daily. 90 tablet 4  . TURMERIC PO Take by mouth.    . vitamin C (ASCORBIC ACID) 500 MG tablet Take 500 mg by mouth daily.    . meclizine (ANTIVERT) 12.5 MG tablet Take 1-2 tablets (12.5-25 mg total) by mouth 3 (three) times daily as needed for dizziness or nausea. (Patient not taking: Reported on 12/16/2019) 60 tablet 1   No facility-administered medications prior to visit.    ROS: Review of Systems  Constitutional: Negative for activity change, appetite change, chills, fatigue and unexpected weight change.  HENT: Negative for congestion, mouth sores and sinus pressure.   Eyes: Negative for visual disturbance.  Respiratory: Negative for cough and chest tightness.   Gastrointestinal: Negative for abdominal pain and nausea.  Genitourinary: Negative for difficulty urinating, frequency and vaginal pain.  Musculoskeletal: Negative for back pain and gait problem.  Skin: Negative for pallor and rash.  Neurological: Positive for dizziness. Negative for tremors, weakness, numbness and  headaches.  Psychiatric/Behavioral: Negative for confusion and sleep disturbance.    Objective:  BP 122/60 (BP Location: Left Arm)   Pulse 85   Temp 98.8 F (37.1 C) (Oral)   Wt 126 lb 3.2 oz (57.2 kg)   SpO2 97%   BMI 22.36 kg/m   BP Readings from Last 3 Encounters:  12/16/19 122/60  12/03/19 110/68  01/27/19 134/65    Wt Readings from Last 3 Encounters:  12/16/19 126 lb 3.2 oz (57.2 kg)  12/03/19 126 lb (57.2 kg)  01/27/19 131 lb 4.8 oz (59.6 kg)    Physical Exam Constitutional:      General: She is not in acute distress.    Appearance: She is well-developed.  HENT:     Head: Normocephalic.     Right Ear: External ear normal.     Left Ear: External ear normal.     Nose: Nose normal.  Eyes:     General:        Right eye: No discharge.        Left eye: No discharge.     Conjunctiva/sclera: Conjunctivae normal.     Pupils: Pupils are equal, round, and reactive to light.  Neck:     Thyroid: No thyromegaly.     Vascular: No JVD.     Trachea: No tracheal deviation.  Cardiovascular:     Rate and  Rhythm: Normal rate and regular rhythm.     Heart sounds: Normal heart sounds.  Pulmonary:     Effort: No respiratory distress.     Breath sounds: No stridor. No wheezing.  Abdominal:     General: Bowel sounds are normal. There is no distension.     Palpations: Abdomen is soft. There is no mass.     Tenderness: There is no abdominal tenderness. There is no guarding or rebound.  Musculoskeletal:        General: No tenderness.     Cervical back: Normal range of motion and neck supple.  Lymphadenopathy:     Cervical: No cervical adenopathy.  Skin:    Findings: No erythema or rash.  Neurological:     Cranial Nerves: No cranial nerve deficit.     Motor: No abnormal muscle tone.     Coordination: Coordination normal.     Deep Tendon Reflexes: Reflexes normal.  Psychiatric:        Behavior: Behavior normal.        Thought Content: Thought content normal.         Judgment: Judgment normal.     Lab Results  Component Value Date   WBC 12.5 (H) 01/30/2018   HGB 12.5 01/30/2018   HCT 37.8 01/30/2018   PLT 235 01/30/2018   GLUCOSE 188 (H) 01/30/2018   CHOL 164 04/27/2017   TRIG 105.0 04/27/2017   HDL 50.60 04/27/2017   LDLDIRECT 139.4 02/16/2006   LDLCALC 92 04/27/2017   ALT 13 01/30/2018   AST 22 01/30/2018   NA 138 01/30/2018   K 3.8 01/30/2018   CL 103 01/30/2018   CREATININE 0.75 01/30/2018   BUN 10 01/30/2018   CO2 25 01/30/2018   TSH 1.98 04/27/2017   INR 1.01 01/30/2018   HGBA1C 7.5 (A) 12/03/2019   MICROALBUR 1.6 04/27/2017    MM CLIP PLACEMENT RIGHT  Result Date: 07/14/2019 CLINICAL DATA:  74 year old female status post ultrasound-guided biopsy of the right breast. EXAM: DIAGNOSTIC RIGHT MAMMOGRAM POST ULTRASOUND BIOPSY COMPARISON:  Previous exam(s). FINDINGS: Mammographic images were obtained following ultrasound guided biopsy of the right breast. The biopsy marking clip is in expected position at the site of biopsy. IMPRESSION: Appropriate positioning of the ribbon shaped biopsy marking clip at the site of biopsy in the medial right breast. Final Assessment: Post Procedure Mammograms for Marker Placement Electronically Signed   By: Kristopher Oppenheim M.D.   On: 07/14/2019 08:34   [place] RT BREAST BX W LOC DEV 1ST LESION IMG BX SPEC US GUIDE  Addendum Date: 07/15/2019   ADDENDUM REPORT: 07/15/2019 14:21 ADDENDUM: Pathology revealed FIBROCYSTIC CHANGE WITH USUAL DUCTAL HYPERPLASIA of the Right breast, 2 o'clock, 2cmfn. This was found to be concordant by Dr. Kristopher Oppenheim. Pathology results were discussed with the patient by telephone. The patient reported doing well after the biopsy with minimal tenderness at the site. Post biopsy instructions and care were reviewed and questions were answered. The patient was encouraged to call [facility] for any additional concerns. The patient was asked to return for a right  axillary ultrasound 4 weeks after her second COVID-19 vaccination, scheduled for July 31, 2019. She was informed a reminder notice would be sent regarding this appointment. Further recommendations will be guided by the results of this imaging study. Pathology results reported by Terie Purser, RN on 07/15/2019. Electronically Signed   By: Kristopher Oppenheim M.D.   On: 07/15/2019 14:21   Result Date: 07/15/2019 CLINICAL  DATA:  74 year old female with an indeterminate right breast mass. EXAM: ULTRASOUND GUIDED RIGHT BREAST CORE NEEDLE BIOPSY COMPARISON:  Previous exam(s). PROCEDURE: I met with the patient and we discussed the procedure of ultrasound-guided biopsy, including benefits and alternatives. We discussed the high likelihood of a successful procedure. We discussed the risks of the procedure, including infection, bleeding, tissue injury, clip migration, and inadequate sampling. Informed written consent was given. The usual time-out protocol was performed immediately prior to the procedure. Lesion quadrant: Upper inner quadrant Using sterile technique and 1% Lidocaine as local anesthetic, under direct ultrasound visualization, a 12 gauge spring-loaded device was used to perform biopsy of a mass at the 2 o'clock position 2 cm from the nipple using a superior approach. At the conclusion of the procedure ribbon shaped tissue marker clip was deployed into the biopsy cavity. Follow up 2 view mammogram was performed and dictated separately. IMPRESSION: Ultrasound guided biopsy of the right breast. No apparent complications. Electronically Signed: By: Kristopher Oppenheim M.D. On: 07/14/2019 08:34    Assessment & Plan:    Walker Kehr, MD

## 2019-12-16 NOTE — Assessment & Plan Note (Signed)
Benign Positional Vertigo symptoms   Meclizine. Start Brandt - Daroff exercise several times a day as dirrected.  

## 2019-12-16 NOTE — Assessment & Plan Note (Signed)
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

## 2020-01-01 ENCOUNTER — Telehealth: Payer: Self-pay | Admitting: Hematology and Oncology

## 2020-01-01 NOTE — Telephone Encounter (Signed)
Rescheduled per provider. Called pt no answer and unable to leave a msg. Mailing out appt letter and calendar printout

## 2020-01-27 ENCOUNTER — Ambulatory Visit: Payer: Medicare Other | Admitting: Hematology and Oncology

## 2020-02-04 ENCOUNTER — Other Ambulatory Visit: Payer: Self-pay

## 2020-02-04 ENCOUNTER — Inpatient Hospital Stay: Payer: Medicare PPO | Attending: Hematology and Oncology | Admitting: Hematology and Oncology

## 2020-02-04 DIAGNOSIS — Z79810 Long term (current) use of selective estrogen receptor modulators (SERMs): Secondary | ICD-10-CM | POA: Insufficient documentation

## 2020-02-04 DIAGNOSIS — C50412 Malignant neoplasm of upper-outer quadrant of left female breast: Secondary | ICD-10-CM | POA: Insufficient documentation

## 2020-02-04 DIAGNOSIS — Z923 Personal history of irradiation: Secondary | ICD-10-CM | POA: Diagnosis not present

## 2020-02-04 DIAGNOSIS — Z170 Estrogen receptor positive status [ER+]: Secondary | ICD-10-CM | POA: Insufficient documentation

## 2020-02-04 DIAGNOSIS — G250 Essential tremor: Secondary | ICD-10-CM | POA: Diagnosis not present

## 2020-02-04 DIAGNOSIS — C50411 Malignant neoplasm of upper-outer quadrant of right female breast: Secondary | ICD-10-CM | POA: Insufficient documentation

## 2020-02-04 MED ORDER — ASHWAGANDHA 125 MG PO CAPS: 1.0000 | ORAL_CAPSULE | Freq: Every day | ORAL | Status: DC

## 2020-02-04 MED ORDER — SAW PALMETTO (SERENOA REPENS) 80 MG PO CAPS: 80.0000 mg | ORAL_CAPSULE | Freq: Two times a day (BID) | ORAL | Status: AC

## 2020-02-04 MED ORDER — TAMOXIFEN CITRATE 20 MG PO TABS: 20.0000 mg | ORAL_TABLET | Freq: Every day | ORAL | 4 refills | Status: DC

## 2020-02-04 NOTE — Assessment & Plan Note (Signed)
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 Breast exam done by her gynecologist and at the breast center were normal May 2019, patient refused breast exam today. 2. Mammograms 6/8/2020normal, breast density category B  Tremors and intermittent ataxia: I discussed with her that this is not Parkinson's because it is not resting tremor. Dr. Posey Rea is addressing this.  Patient has tenderness in the breast and would like to get a diagnostic mammogram every year.  I will send the order for that.  This will need to be ordered every year as a diagnostic mammogram.  Return to clinic in 1 year for follow-up

## 2020-02-04 NOTE — Progress Notes (Signed)
Patient Care Team: Plotnikov, Evie Lacks, MD as PCP - General (Internal Medicine)  DIAGNOSIS:    ICD-10-CM   1. Malignant neoplasm of upper-outer quadrant of right breast in female, estrogen receptor positive ([place])  C50.411    Z17.0   2. Malignant neoplasm of upper-outer quadrant of left breast in female, estrogen receptor positive ([place])  C50.412 tamoxifen (NOLVADEX) 20 MG tablet   Z17.0     SUMMARY OF ONCOLOGIC HISTORY: Oncology History  Breast cancer of upper-outer quadrant of right female breast ([place])  06/04/2012 Initial Diagnosis   Right breast: Invasive ductal carcinoma and DCIS with calcifications, ER 100%, PR 100%, K6 is on 15%, HER-2 negative   06/10/2012 Breast MRI   Breast MRI showing 1.4 cm mass upper outer quadrant right breast   06/27/2012 Surgery   Right breast partial mastectomy and SLN biopsy: 0.9 cm, grade 1, IDC with DCIS, 2 SLN negative   08/05/2012 - 08/26/2012 Radiation Therapy   Adjuvant radiation therapy   09/26/2012 -  Anti-estrogen oral therapy   Tamoxifen 20 mg daily     CHIEF COMPLIANT: Follow-up of right breast cancer on tamoxifen   INTERVAL HISTORY: Kathleen Harris is a 75 y.o. with above-mentioned history of right breast cancer treated with lumpectomy, radiation, and who is currently on tamoxifen. Mammogram on 07/08/19 after right breast pain showed a 2.0cm mass at the 2 o'clock position. Biopsy on 07/14/19 showed fibrocystic changes with no evidence of malignancy. She presents to the clinic today for annual follow-up.   She tells me that she is tolerating tamoxifen extremely well without any problems.  Denies any lumps or nodules in the breast.  She informed me that she is no longer seeing Dr. Alain Marion.  She informed me that her daughter was diagnosed with a gastric lymphoma and had undergone treatments and the most recent scans apparently showed that she is in remission.  ALLERGIES:  is allergic to latex, codeine, effexor [venlafaxine], penicillins,  and sulfonamide derivatives.  MEDICATIONS:  Current Outpatient Medications  Medication Sig Dispense Refill  . Ashwagandha 125 MG CAPS Take 1 capsule by mouth daily.    . saw palmetto 80 MG capsule Take 1 capsule (80 mg total) by mouth 2 (two) times daily.    Marland Kitchen aspirin 81 MG tablet Take 81 mg by mouth daily.    Marland Kitchen b complex vitamins tablet Take 1 tablet by mouth daily.    . Cholecalciferol (VITAMIN D3) 50 MCG (2000 UT) capsule Take 1 capsule (2,000 Units total) by mouth daily. 100 capsule 3  . diazepam (VALIUM) 5 MG tablet Take 1-2 tablets (5-10 mg total) by mouth every 12 (twelve) hours as needed for anxiety (vertigo). 60 tablet 1  . MAGNESIUM PO Take by mouth.    . metFORMIN (GLUCOPHAGE) 500 MG tablet TAKE 1 TABLET BY MOUTH TWICE DAILY WITH A  MEAL 180 tablet 3  . tamoxifen (NOLVADEX) 20 MG tablet Take 1 tablet (20 mg total) by mouth daily. 90 tablet 4  . vitamin C (ASCORBIC ACID) 500 MG tablet Take 500 mg by mouth daily.     No current facility-administered medications for this visit.    PHYSICAL EXAMINATION: ECOG PERFORMANCE STATUS: 1 - Symptomatic but completely ambulatory  Vitals:   02/04/20 1019  BP: (!) 129/52  Pulse: (!) 107  Resp: 16  Temp: (!) 97 F (36.1 C)  SpO2: 100%   Filed Weights   02/04/20 1019  Weight: 128 lb 3.2 oz (58.2 kg)  LABORATORY DATA:  I have reviewed the data as listed CMP Latest Ref Rng & Units 01/30/2018 04/27/2017 01/03/2016  Glucose 70 - 99 mg/dL 188(H) 199(H) 168(H)  BUN 8 - 23 mg/dL _0 Creatinine 0.44 - 1.00 mg/dL 0.75 0.62 0.60  Sodium 135 - 145 mmol/L 138 139 140  Potassium 3.5 - 5.1 mmol/L 3.8 3.9 4.1  Chloride 98 - 111 mmol/L 103 102 102  CO2 22 - 32 mmol/L _1 Calcium 8.9 - 10.3 mg/dL 10.2 9.8 10.4  Total Protein 6.5 - 8.1 g/dL 5.8(L) 6.4 6.7  Total Bilirubin 0.3 - 1.2 mg/dL 0.4 0.5 0.6  Alkaline Phos 38 - 126 U/L 39 55 53  AST 15 - 41 U/L _2 ALT 0 - 44 U/L _3 Lab Results  Component Value Date    WBC 12.5 (H) 01/30/2018   HGB 12.5 01/30/2018   HCT 37.8 01/30/2018   MCV 93.6 01/30/2018   PLT 235 01/30/2018   NEUTROABS 8.7 (H) 01/30/2018    ASSESSMENT & PLAN:  Breast cancer of upper-outer quadrant of right female breast ([place]) 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 Breast exam done by her gynecologist and at the breast center were normal May 2019, patient refused breast exam today. 2. Mammograms 6/8/2020normal, breast density category B  Iintermittent ataxia: Marked improvement after she flushed her ears Tremors: Essential tremor: No active treatment needed. Her daughter was diagnosed with gastric lymphoma and received treatment and she is in remission. She has been extremely stressed about her daughter and had required Valium periodically.  Return to clinic in 1 year for follow-up    No orders of the defined types were placed in this encounter.  The patient has a good understanding of the overall plan. she agrees with it. she will call with any problems that may develop before the next visit here.  Total time spent: 20 mins including face to face time and time spent for planning, charting and coordination of care  Nicholas Lose, MD 02/04/2020  I, Cloyde Reams Dorshimer, am acting as scribe for Dr. Nicholas Lose.  I have reviewed the above documentation for accuracy and completeness, and I agree with the above.

## 2020-02-06 ENCOUNTER — Telehealth: Payer: Self-pay | Admitting: Hematology and Oncology

## 2020-02-06 NOTE — Telephone Encounter (Signed)
Scheduled per 1/5 los. Called pt no answer and unable to leave a msg. Mailing appt letter and calendar printout

## 2020-04-14 ENCOUNTER — Ambulatory Visit: Payer: Medicare PPO | Admitting: Internal Medicine

## 2020-04-26 ENCOUNTER — Other Ambulatory Visit: Payer: Self-pay | Admitting: Hematology and Oncology

## 2020-04-26 DIAGNOSIS — Z1231 Encounter for screening mammogram for malignant neoplasm of breast: Secondary | ICD-10-CM

## 2020-05-07 DIAGNOSIS — R251 Tremor, unspecified: Secondary | ICD-10-CM | POA: Diagnosis not present

## 2020-05-07 DIAGNOSIS — C50911 Malignant neoplasm of unspecified site of right female breast: Secondary | ICD-10-CM | POA: Diagnosis not present

## 2020-05-07 DIAGNOSIS — Z0000 Encounter for general adult medical examination without abnormal findings: Secondary | ICD-10-CM | POA: Diagnosis not present

## 2020-05-07 DIAGNOSIS — E119 Type 2 diabetes mellitus without complications: Secondary | ICD-10-CM | POA: Diagnosis not present

## 2020-07-06 ENCOUNTER — Other Ambulatory Visit: Payer: Self-pay | Admitting: Hematology and Oncology

## 2020-07-06 DIAGNOSIS — N63 Unspecified lump in unspecified breast: Secondary | ICD-10-CM

## 2020-07-09 ENCOUNTER — Other Ambulatory Visit: Payer: Self-pay

## 2020-07-09 ENCOUNTER — Ambulatory Visit
Admission: RE | Admit: 2020-07-09 | Discharge: 2020-07-09 | Disposition: A | Discharge status: Home / Self Care | Payer: Medicare PPO | Source: Ambulatory Visit | Attending: Hematology and Oncology | Admitting: Hematology and Oncology

## 2020-07-09 DIAGNOSIS — Z853 Personal history of malignant neoplasm of breast: Secondary | ICD-10-CM | POA: Diagnosis not present

## 2020-07-09 DIAGNOSIS — N6489 Other specified disorders of breast: Secondary | ICD-10-CM | POA: Diagnosis not present

## 2020-07-09 DIAGNOSIS — N63 Unspecified lump in unspecified breast: Secondary | ICD-10-CM

## 2020-07-09 DIAGNOSIS — R928 Other abnormal and inconclusive findings on diagnostic imaging of breast: Secondary | ICD-10-CM | POA: Diagnosis not present

## 2020-11-08 DIAGNOSIS — R300 Dysuria: Secondary | ICD-10-CM | POA: Diagnosis not present

## 2020-11-08 DIAGNOSIS — C50911 Malignant neoplasm of unspecified site of right female breast: Secondary | ICD-10-CM | POA: Diagnosis not present

## 2020-11-08 DIAGNOSIS — E119 Type 2 diabetes mellitus without complications: Secondary | ICD-10-CM | POA: Diagnosis not present

## 2020-11-08 DIAGNOSIS — R251 Tremor, unspecified: Secondary | ICD-10-CM | POA: Diagnosis not present

## 2020-12-27 DIAGNOSIS — D3132 Benign neoplasm of left choroid: Secondary | ICD-10-CM | POA: Diagnosis not present

## 2020-12-27 DIAGNOSIS — H25811 Combined forms of age-related cataract, right eye: Secondary | ICD-10-CM | POA: Diagnosis not present

## 2021-02-02 NOTE — Progress Notes (Incomplete)
Patient Care Team: Plotnikov, Evie Lacks, MD as PCP - General (Internal Medicine)  DIAGNOSIS: No diagnosis found.  SUMMARY OF ONCOLOGIC HISTORY: Oncology History  Breast cancer of upper-outer quadrant of right female breast ([place])  06/04/2012 Initial Diagnosis   Right breast: Invasive ductal carcinoma and DCIS with calcifications, ER 100%, PR 100%, K6 is on 15%, HER-2 negative   06/10/2012 Breast MRI   Breast MRI showing 1.4 cm mass upper outer quadrant right breast   06/27/2012 Surgery   Right breast partial mastectomy and SLN biopsy: 0.9 cm, grade 1, IDC with DCIS, 2 SLN negative   08/05/2012 - 08/26/2012 Radiation Therapy   Adjuvant radiation therapy   09/26/2012 -  Anti-estrogen oral therapy   Tamoxifen 20 mg daily     CHIEF COMPLIANT: Follow-up of right breast cancer on tamoxifen   INTERVAL HISTORY: Kathleen Harris is a 76 y.o. with above-mentioned history of right breast cancer treated with lumpectomy, radiation, and who is currently on tamoxifen. Mammogram on 07/09/2020 after right breast pain showed a 2.0cm mass at the 2 o'clock position. She presents to the clinic today for annual follow-up.   ALLERGIES:  is allergic to latex, codeine, effexor [venlafaxine], penicillins, and sulfonamide derivatives.  MEDICATIONS:  Current Outpatient Medications  Medication Sig Dispense Refill   Ashwagandha 125 MG CAPS Take 1 capsule by mouth daily.     aspirin 81 MG tablet Take 81 mg by mouth daily.     b complex vitamins tablet Take 1 tablet by mouth daily.     Cholecalciferol (VITAMIN D3) 50 MCG (2000 UT) capsule Take 1 capsule (2,000 Units total) by mouth daily. 100 capsule 3   diazepam (VALIUM) 5 MG tablet Take 1-2 tablets (5-10 mg total) by mouth every 12 (twelve) hours as needed for anxiety (vertigo). 60 tablet 1   MAGNESIUM PO Take by mouth.     metFORMIN (GLUCOPHAGE) 500 MG tablet TAKE 1 TABLET BY MOUTH TWICE DAILY WITH A  MEAL 180 tablet 3   saw palmetto 80 MG capsule Take  1 capsule (80 mg total) by mouth 2 (two) times daily.     tamoxifen (NOLVADEX) 20 MG tablet Take 1 tablet (20 mg total) by mouth daily. 90 tablet 4   vitamin C (ASCORBIC ACID) 500 MG tablet Take 500 mg by mouth daily.     No current facility-administered medications for this visit.    PHYSICAL EXAMINATION: ECOG PERFORMANCE STATUS: {CHL ONC ECOG PS:[phone]}  There were no vitals filed for this visit. There were no vitals filed for this visit.  BREAST:*** No palpable masses or nodules in either right or left breasts. No palpable axillary supraclavicular or infraclavicular adenopathy no breast tenderness or nipple discharge. (exam performed in the presence of a chaperone)  LABORATORY DATA:  I have reviewed the data as listed CMP Latest Ref Rng & Units 01/30/2018 04/27/2017 01/03/2016  Glucose 70 - 99 mg/dL 188(H) 199(H) 168(H)  BUN 8 - 23 mg/dL _0 Creatinine 0.44 - 1.00 mg/dL 0.75 0.62 0.60  Sodium 135 - 145 mmol/L 138 139 140  Potassium 3.5 - 5.1 mmol/L 3.8 3.9 4.1  Chloride 98 - 111 mmol/L 103 102 102  CO2 22 - 32 mmol/L _1 Calcium 8.9 - 10.3 mg/dL 10.2 9.8 10.4  Total Protein 6.5 - 8.1 g/dL 5.8(L) 6.4 6.7  Total Bilirubin 0.3 - 1.2 mg/dL 0.4 0.5 0.6  Alkaline Phos 38 - 126 U/L 39 55 53  AST 15 - 41 U/L  _0 ALT 0 - 44 U/L _1 Lab Results  Component Value Date   WBC 12.5 (H) 01/30/2018   HGB 12.5 01/30/2018   HCT 37.8 01/30/2018   MCV 93.6 01/30/2018   PLT 235 01/30/2018   NEUTROABS 8.7 (H) 01/30/2018    ASSESSMENT & PLAN:  No problem-specific Assessment & Plan notes found for this encounter.    No orders of the defined types were placed in this encounter.  The patient has a good understanding of the overall plan. she agrees with it. she will call with any problems that may develop before the next visit here.  Total time spent: *** mins including face to face time and time spent for planning, charting and coordination of care  Rulon Eisenmenger, MD, MPH 02/02/2021  I, Thana Ates, am acting as scribe for Dr. Nicholas Lose.  {insert scribe attestation}

## 2021-02-03 ENCOUNTER — Inpatient Hospital Stay: Payer: Medicare PPO | Attending: Hematology and Oncology | Admitting: Hematology and Oncology

## 2021-02-03 NOTE — Assessment & Plan Note (Deleted)
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 Breast exam done by her gynecologist  patient refused breast exam today. 2. Mammograms6/10/2022normal, breast density category B  Iintermittent ataxia: Marked improvement after she flushed her ears Tremors: Essential tremor: No active treatment needed. Her daughter was diagnosed with gastric lymphoma and received treatment and she is in remission. She has been extremely stressed about her daughter and had required Valium periodically.  Return to clinic in 1 year for follow-up

## 2021-03-11 ENCOUNTER — Other Ambulatory Visit: Payer: Self-pay | Admitting: Hematology and Oncology

## 2021-03-11 DIAGNOSIS — C50412 Malignant neoplasm of upper-outer quadrant of left female breast: Secondary | ICD-10-CM

## 2021-03-11 DIAGNOSIS — Z170 Estrogen receptor positive status [ER+]: Secondary | ICD-10-CM

## 2021-03-18 ENCOUNTER — Telehealth: Payer: Self-pay | Admitting: Hematology and Oncology

## 2021-03-18 NOTE — Telephone Encounter (Signed)
Unable to leave message, attempted to sch per 2/13 inbasket, mailed calendar

## 2021-03-30 ENCOUNTER — Other Ambulatory Visit: Payer: Self-pay | Admitting: Surgery

## 2021-03-30 DIAGNOSIS — Z1231 Encounter for screening mammogram for malignant neoplasm of breast: Secondary | ICD-10-CM

## 2021-05-11 NOTE — Progress Notes (Signed)
? ?Patient Care Team: ?Plotnikov, Evie Lacks, MD as PCP - General (Internal Medicine) ? ?DIAGNOSIS:  ?Encounter Diagnoses  ?Name Primary?  ? Malignant neoplasm of upper-outer quadrant of right breast in female, estrogen receptor positive ([place])   ? Malignant neoplasm of upper-outer quadrant of left breast in female, estrogen receptor positive ([place])   ? ? ?SUMMARY OF ONCOLOGIC HISTORY: ?Oncology History  ?Breast cancer of upper-outer quadrant of right female breast ([place])  ?06/04/2012 Initial Diagnosis  ? 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 ?  ?06/10/2012 Breast MRI  ? Breast MRI showing 1.4 cm mass upper outer quadrant right breast ? ?  ?06/27/2012 Surgery  ? Right breast partial mastectomy and SLN biopsy: 0.9 cm, grade 1, IDC with DCIS, 2 SLN negative ? ?  ?08/05/2012 - 08/26/2012 Radiation Therapy  ? Adjuvant radiation therapy ? ?  ?09/26/2012 -  Anti-estrogen oral therapy  ? Tamoxifen 20 mg daily ? ?  ? ? ?CHIEF COMPLIANT: Follow-up of right breast cancer on tamoxifen  ? ?INTERVAL HISTORY: Kathleen Harris is a  76 y.o. with above-mentioned history of right breast cancer treated with lumpectomy, radiation, and who is currently on tamoxifen. She presents to the clinic today for annual follow-up. She is tolerating the Tamoxifen. She denies any symptoms, never had a problem taking it. Denies pain and discomfort in the breast. ? ? ?ALLERGIES:  is allergic to latex, codeine, effexor [venlafaxine], penicillins, and sulfonamide derivatives. ? ?MEDICATIONS:  ?Current Outpatient Medications  ?Medication Sig Dispense Refill  ? aspirin 81 MG tablet Take 81 mg by mouth daily.    ? Cholecalciferol (VITAMIN D3) 50 MCG (2000 UT) capsule Take 1 capsule (2,000 Units total) by mouth daily. 100 capsule 3  ? diazepam (VALIUM) 5 MG tablet Take 1-2 tablets (5-10 mg total) by mouth every 12 (twelve) hours as needed for anxiety (vertigo). 60 tablet 1  ?  MAGNESIUM PO Take by mouth.    ? metFORMIN (GLUCOPHAGE) 500 MG tablet Take 2 tablets (1,000 mg total) by mouth 2 (two) times daily with a meal. TAKE 1 TABLET BY MOUTH TWICE DAILY WITH A  MEAL 180 tablet 3  ? saw palmetto 80 MG capsule Take 1 capsule (80 mg total) by mouth 2 (two) times daily.    ? tamoxifen (NOLVADEX) 20 MG tablet Take 1 tablet (20 mg total) by mouth daily. 90 tablet 4  ? ?No current facility-administered medications for this visit.  ? ? ?PHYSICAL EXAMINATION: ?ECOG PERFORMANCE STATUS: 1 - Symptomatic but completely ambulatory ? ?Vitals:  ? 05/25/21 1015  ?BP: (!) 138/53  ?Pulse: 84  ?Resp: 18  ?Temp: (!) 97.5 ?F (36.4 ?C)  ?SpO2: 99%  ? ?Filed Weights  ? 05/25/21 1015  ?Weight: 119 lb 11.2 oz (54.3 kg)  ? ?  ? ?LABORATORY DATA:  ?I have reviewed the data as listed ? ?  Latest Ref Rng & Units 01/30/2018  ?  6:00 PM 04/27/2017  ?  8:15 AM 01/03/2016  ?  9:07 AM  ?CMP  ?Glucose 70 - 99 mg/dL 188   199   168    ?BUN 8 - 23 mg/dL $Remove'10   16   12    'yrUkfhm$ ?Creatinine 0.44 - 1.00 mg/dL 0.75   0.62   0.60    ?Sodium 135 - 145 mmol/L 138   139   140    ?Potassium 3.5 - 5.1 mmol/L 3.8   3.9  4.1    ?Chloride 98 - 111 mmol/L 103   102   102    ?CO2 22 - 32 mmol/L $RemoveB'25   29   29    'apySQcRo$ ?Calcium 8.9 - 10.3 mg/dL 10.2   9.8   10.4    ?Total Protein 6.5 - 8.1 g/dL 5.8   6.4   6.7    ?Total Bilirubin 0.3 - 1.2 mg/dL 0.4   0.5   0.6    ?Alkaline Phos 38 - 126 U/L 39   55   53    ?AST 15 - 41 U/L $Remo'22   16   18    'UlsWF$ ?ALT 0 - 44 U/L $Remo'13   14   17    'FQDbN$ ? ? ?Lab Results  ?Component Value Date  ? WBC 12.5 (H) 01/30/2018  ? HGB 12.5 01/30/2018  ? HCT 37.8 01/30/2018  ? MCV 93.6 01/30/2018  ? PLT 235 01/30/2018  ? NEUTROABS 8.7 (H) 01/30/2018  ? ? 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  ?Tamoxifen  toxicities: None other than hot flashes which she attributes to menopausal symptoms ?  ?Breast cancer surveillance: ?1. Breast exam done by her gynecologist and at the breast center were normal  , patient does not like [place] to do breast exams ?2. Mammograms 07/11/2020: Benign, breast density category B ?  ?Iintermittent ataxia: Marked improvement after she flushed her ears ?Tremors: Essential tremor: No active treatment needed. ?  ?She has been extremely stressed about her daughter and had required Valium periodically. ?  ?Return to clinic in 1 year for follow-up ? ? ? ?Orders Placed This Encounter  ?Procedures  ? MM DIAG BREAST TOMO BILATERAL  ?  Standing Status:   Future  ?  Standing Expiration Date:   05/26/2022  ?  Order Specific Question:   Reason for Exam (SYMPTOM  OR DIAGNOSIS REQUIRED)  ?  Answer:   annual diagnostic mammograms  ?  Order Specific Question:   Preferred imaging location?  ?  Answer:   [facility]  ?  Order Specific Question:   Release to patient  ?  Answer:   Immediate  ? ?The patient has a good understanding of the overall plan. she agrees with it. she will call with any problems that may develop before the next visit here. ?Total time spent: 30 mins including face to face time and time spent for planning, charting and co-ordination of care ? ? Harriette Ohara, MD ?05/25/21 ? ? ? I Gardiner Coins am scribing for Dr. Lindi Adie ? ?I have reviewed the above documentation for accuracy and completeness, and I agree with the above. ?  ?

## 2021-05-25 ENCOUNTER — Inpatient Hospital Stay: Payer: Medicare PPO | Attending: Hematology and Oncology | Admitting: Hematology and Oncology

## 2021-05-25 ENCOUNTER — Other Ambulatory Visit: Payer: Self-pay

## 2021-05-25 DIAGNOSIS — C50411 Malignant neoplasm of upper-outer quadrant of right female breast: Secondary | ICD-10-CM | POA: Diagnosis not present

## 2021-05-25 DIAGNOSIS — Z170 Estrogen receptor positive status [ER+]: Secondary | ICD-10-CM | POA: Diagnosis not present

## 2021-05-25 DIAGNOSIS — Z923 Personal history of irradiation: Secondary | ICD-10-CM | POA: Diagnosis not present

## 2021-05-25 DIAGNOSIS — C50412 Malignant neoplasm of upper-outer quadrant of left female breast: Secondary | ICD-10-CM | POA: Diagnosis not present

## 2021-05-25 DIAGNOSIS — G250 Essential tremor: Secondary | ICD-10-CM | POA: Insufficient documentation

## 2021-05-25 DIAGNOSIS — Z79810 Long term (current) use of selective estrogen receptor modulators (SERMs): Secondary | ICD-10-CM | POA: Diagnosis not present

## 2021-05-25 MED ORDER — METFORMIN HCL 500 MG PO TABS: 1000.0000 mg | ORAL_TABLET | Freq: Two times a day (BID) | ORAL | 3 refills | Status: AC

## 2021-05-25 MED ORDER — TAMOXIFEN CITRATE 20 MG PO TABS: 20.0000 mg | ORAL_TABLET | Freq: Every day | ORAL | 4 refills | Status: DC

## 2021-05-25 NOTE — Assessment & Plan Note (Signed)
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 ?Tamoxifen toxicities: None other than hot flashes which she attributes to menopausal symptoms ?? ?Breast cancer surveillance: ?1. Breast exam done by her gynecologist and at the breast center were normal May 2019, patient refused breast exam today. ?2. Mammograms 07/11/2020: Benign, breast density category B ?? ?Iintermittent ataxia: Marked improvement after she flushed her ears ?Tremors: Essential tremor: No active treatment needed. ?Her daughter was diagnosed with gastric lymphoma and received treatment and she is in remission. ?She has been extremely stressed about her daughter and had required Valium periodically. ?? ?Return to clinic in 1 year for follow-up ?

## 2021-06-09 DIAGNOSIS — C50911 Malignant neoplasm of unspecified site of right female breast: Secondary | ICD-10-CM | POA: Diagnosis not present

## 2021-06-09 DIAGNOSIS — R251 Tremor, unspecified: Secondary | ICD-10-CM | POA: Diagnosis not present

## 2021-06-09 DIAGNOSIS — E119 Type 2 diabetes mellitus without complications: Secondary | ICD-10-CM | POA: Diagnosis not present

## 2021-06-09 DIAGNOSIS — Z136 Encounter for screening for cardiovascular disorders: Secondary | ICD-10-CM | POA: Diagnosis not present

## 2021-06-09 DIAGNOSIS — Z0000 Encounter for general adult medical examination without abnormal findings: Secondary | ICD-10-CM | POA: Diagnosis not present

## 2021-06-15 DIAGNOSIS — H25813 Combined forms of age-related cataract, bilateral: Secondary | ICD-10-CM | POA: Diagnosis not present

## 2021-06-15 DIAGNOSIS — D3132 Benign neoplasm of left choroid: Secondary | ICD-10-CM | POA: Diagnosis not present

## 2021-07-11 ENCOUNTER — Ambulatory Visit
Admission: RE | Admit: 2021-07-11 | Discharge: 2021-07-11 | Disposition: A | Discharge status: Home / Self Care | Payer: Medicare PPO | Source: Ambulatory Visit | Attending: Surgery | Admitting: Surgery

## 2021-07-11 DIAGNOSIS — Z1231 Encounter for screening mammogram for malignant neoplasm of breast: Secondary | ICD-10-CM | POA: Diagnosis not present

## 2022-04-05 DIAGNOSIS — H5201 Hypermetropia, right eye: Secondary | ICD-10-CM | POA: Diagnosis not present

## 2022-04-05 DIAGNOSIS — H524 Presbyopia: Secondary | ICD-10-CM | POA: Diagnosis not present

## 2022-04-05 DIAGNOSIS — H25813 Combined forms of age-related cataract, bilateral: Secondary | ICD-10-CM | POA: Diagnosis not present

## 2022-04-05 DIAGNOSIS — D3132 Benign neoplasm of left choroid: Secondary | ICD-10-CM | POA: Diagnosis not present

## 2022-04-26 ENCOUNTER — Other Ambulatory Visit: Payer: Self-pay | Admitting: Family Medicine

## 2022-04-26 DIAGNOSIS — Z1231 Encounter for screening mammogram for malignant neoplasm of breast: Secondary | ICD-10-CM

## 2022-05-28 NOTE — Assessment & Plan Note (Signed)
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 Breast exam done by her gynecologist and at the breast center were normal  , patient does not like [place] to do breast exams 2. Mammograms 07/12/2021: Benign, breast density category B   Iintermittent ataxia: Marked improvement after she flushed her ears Tremors: Essential tremor: No active treatment needed.   She has been extremely stressed about her daughter and had required Valium periodically.   Return to clinic in 1 year for follow-up

## 2022-05-29 ENCOUNTER — Inpatient Hospital Stay: Payer: Medicare PPO | Attending: Hematology and Oncology | Admitting: Hematology and Oncology

## 2022-05-29 ENCOUNTER — Other Ambulatory Visit: Payer: Self-pay

## 2022-05-29 VITALS — BP 130/62 | HR 112 | Temp 97.5°F | Resp 18 | Ht 62.0 in | Wt 117.9 lb

## 2022-05-29 DIAGNOSIS — Z79810 Long term (current) use of selective estrogen receptor modulators (SERMs): Secondary | ICD-10-CM | POA: Insufficient documentation

## 2022-05-29 DIAGNOSIS — Z923 Personal history of irradiation: Secondary | ICD-10-CM | POA: Diagnosis not present

## 2022-05-29 DIAGNOSIS — C50412 Malignant neoplasm of upper-outer quadrant of left female breast: Secondary | ICD-10-CM | POA: Diagnosis not present

## 2022-05-29 DIAGNOSIS — Z170 Estrogen receptor positive status [ER+]: Secondary | ICD-10-CM | POA: Diagnosis not present

## 2022-05-29 DIAGNOSIS — C50411 Malignant neoplasm of upper-outer quadrant of right female breast: Secondary | ICD-10-CM | POA: Insufficient documentation

## 2022-05-29 MED ORDER — TAMOXIFEN CITRATE 20 MG PO TABS: 20.0000 mg | ORAL_TABLET | Freq: Every day | ORAL | 4 refills | Status: DC

## 2022-05-29 NOTE — Progress Notes (Signed)
Patient Care Team: Plotnikov, Georgina Quint, MD as PCP - General (Internal Medicine)  DIAGNOSIS:  Encounter Diagnoses  Name Primary?   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 with above-mentioned history of right breast cancer treated with lumpectomy, radiation, and who is currently on tamoxifen. She presents to the clinic today for annual follow-up. She reports that she has a lot of mucus in the back of throat. She says she has been sick. She has been taking sudafed for relief. She denies having any fever. She is tolerating the tamoxifen extremely well.   ALLERGIES:  is allergic to latex, codeine, effexor [venlafaxine], penicillins, and sulfonamide derivatives.  MEDICATIONS:  Current Outpatient Medications  Medication Sig Dispense Refill   aspirin 81 MG tablet Take 81 mg by mouth daily.     Cholecalciferol (VITAMIN D3) 50 MCG (2000 UT) capsule Take 1 capsule (2,000 Units total) by mouth daily. 100 capsule 3   diazepam (VALIUM) 5 MG tablet Take 1-2 tablets (5-10 mg total) by mouth every  12 (twelve) hours as needed for anxiety (vertigo). 60 tablet 1   metFORMIN (GLUCOPHAGE) 500 MG tablet Take 2 tablets (1,000 mg total) by mouth 2 (two) times daily with a meal. TAKE 1 TABLET BY MOUTH TWICE DAILY WITH A  MEAL 180 tablet 3   saw palmetto 80 MG capsule Take 1 capsule (80 mg total) by mouth 2 (two) times daily.     tamoxifen (NOLVADEX) 20 MG tablet Take 1 tablet (20 mg total) by mouth daily. 90 tablet 4   No current facility-administered medications for this visit.    PHYSICAL EXAMINATION: ECOG PERFORMANCE STATUS: 1 - Symptomatic but completely ambulatory  Vitals:   05/29/22 1018  BP: 130/62  Pulse: (!) 112  Resp: 18  Temp: (!) 97.5 F (36.4 C)  SpO2: 100%   Filed Weights   05/29/22 1018  Weight: 117 lb 14.4 oz (53.5 kg)      LABORATORY DATA:  I have reviewed the data as listed    Latest Ref Rng & Units 01/30/2018    6:00 PM 04/27/2017    8:15 AM 01/03/2016    9:07 AM  CMP  Glucose 70 - 99 mg/dL 409  811  914   BUN 8 - 23 mg/dL 10  16  12    Creatinine 0.44 - 1.00 mg/dL 7.82  9.56  2.13   Sodium 135 - 145 mmol/L 138  139  140   Potassium 3.5 - 5.1 mmol/L 3.8  3.9  4.1   Chloride 98 - 111 mmol/L 103  102  102   CO2 22 - 32 mmol/L 25  29  29    Calcium 8.9 - 10.3 mg/dL 16.1  9.8  09.6   Total Protein 6.5 - 8.1 g/dL 5.8  6.4  6.7   Total Bilirubin 0.3 - 1.2 mg/dL 0.4  0.5  0.6   Alkaline Phos 38 - 126 U/L 39  55  53   AST 15 - 41 U/L 22  16  18    ALT 0 - 44 U/L 13  14  17      Lab Results  Component Value Date   WBC 12.5 (H) 01/30/2018   HGB 12.5 01/30/2018   HCT 37.8 01/30/2018   MCV 93.6 01/30/2018   PLT 235 01/30/2018   NEUTROABS 8.7 (H) 01/30/2018    ASSESSMENT & PLAN:  Breast cancer of upper-outer quadrant of right female breast ([facility]) 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 Breast exam done by her gynecologist and at the breast center were normal  , patient does not like [place] to do breast exams 2. Mammograms 07/12/2021: Benign, breast density category B   Diabetes: Patient tells me that she will continue to eat sugary foods  Tremors: Essential tremor: No active treatment needed.    Return to clinic in 1 year for follow-up    No orders of the defined types were placed in this encounter.  The patient has a good understanding of the overall plan. she agrees with it. she will call with any problems that may develop before the next visit here. Total time spent: 30 mins including face to face time and time spent for planning, charting and co-ordination of care   Tamsen Meek, MD 05/29/22    I Janan Ridge am acting as a Neurosurgeon for [employer]  I have reviewed the above documentation for accuracy and completeness, and I agree with the above.

## 2022-06-19 DIAGNOSIS — C50911 Malignant neoplasm of unspecified site of right female breast: Secondary | ICD-10-CM | POA: Diagnosis not present

## 2022-06-19 DIAGNOSIS — Z136 Encounter for screening for cardiovascular disorders: Secondary | ICD-10-CM | POA: Diagnosis not present

## 2022-06-19 DIAGNOSIS — R251 Tremor, unspecified: Secondary | ICD-10-CM | POA: Diagnosis not present

## 2022-06-19 DIAGNOSIS — R1013 Epigastric pain: Secondary | ICD-10-CM | POA: Diagnosis not present

## 2022-06-19 DIAGNOSIS — E1165 Type 2 diabetes mellitus with hyperglycemia: Secondary | ICD-10-CM | POA: Diagnosis not present

## 2022-06-19 DIAGNOSIS — E46 Unspecified protein-calorie malnutrition: Secondary | ICD-10-CM | POA: Diagnosis not present

## 2022-06-19 DIAGNOSIS — R300 Dysuria: Secondary | ICD-10-CM | POA: Diagnosis not present

## 2022-06-19 DIAGNOSIS — Z0000 Encounter for general adult medical examination without abnormal findings: Secondary | ICD-10-CM | POA: Diagnosis not present

## 2022-07-14 ENCOUNTER — Ambulatory Visit
Admission: RE | Admit: 2022-07-14 | Discharge: 2022-07-14 | Disposition: A | Discharge status: Home / Self Care | Payer: Medicare PPO | Source: Ambulatory Visit | Attending: Family Medicine | Admitting: Family Medicine

## 2022-07-14 DIAGNOSIS — Z1231 Encounter for screening mammogram for malignant neoplasm of breast: Secondary | ICD-10-CM

## 2023-02-15 IMAGING — MG MM DIGITAL SCREENING BILAT W/ TOMO AND CAD
8 series · 9 of 24 positions shown · non-contrast
Comparison: Previous exam(s).

CLINICAL DATA: Screening.

EXAM:
DIGITAL SCREENING BILATERAL MAMMOGRAM WITH TOMOSYNTHESIS AND CAD
TECHNIQUE: Bilateral screening digital craniocaudal and mediolateral oblique
mammograms were obtained. Bilateral screening digital breast
tomosynthesis was performed. The images were evaluated with
computer-aided detection.

[R MLO synth-2D]
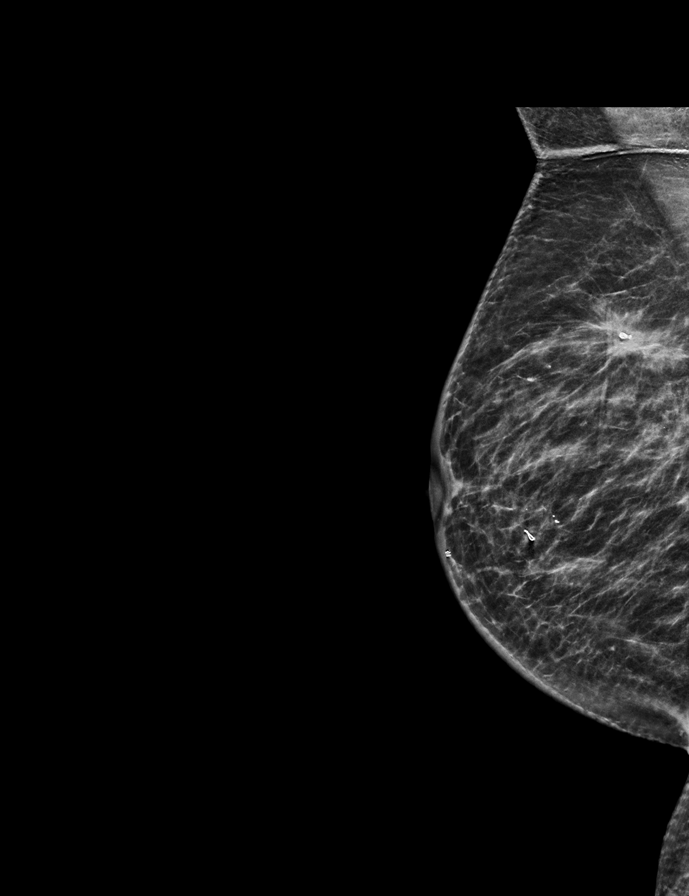

[R CC synth-2D]
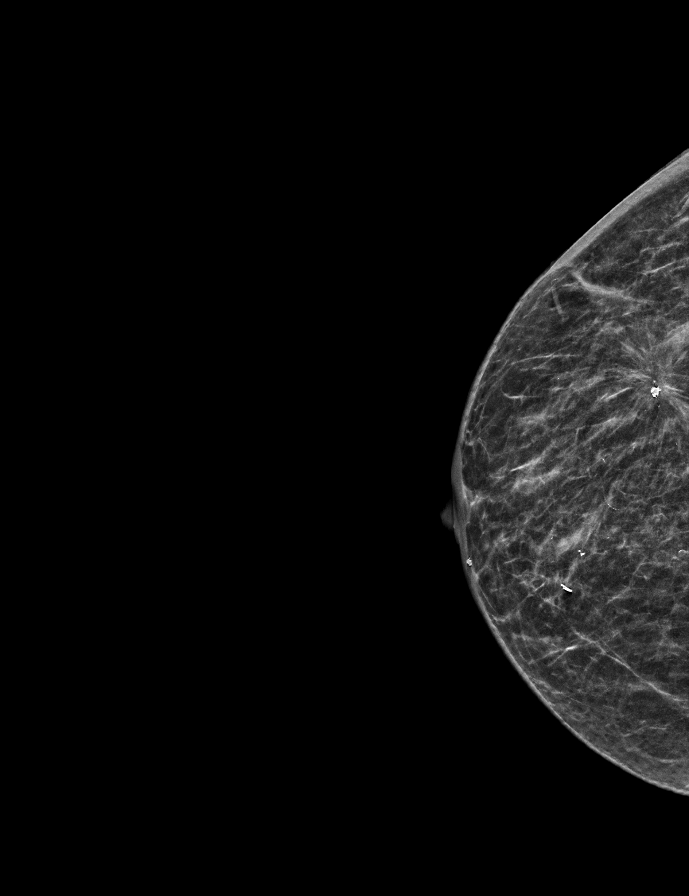

[L MLO synth-2D]
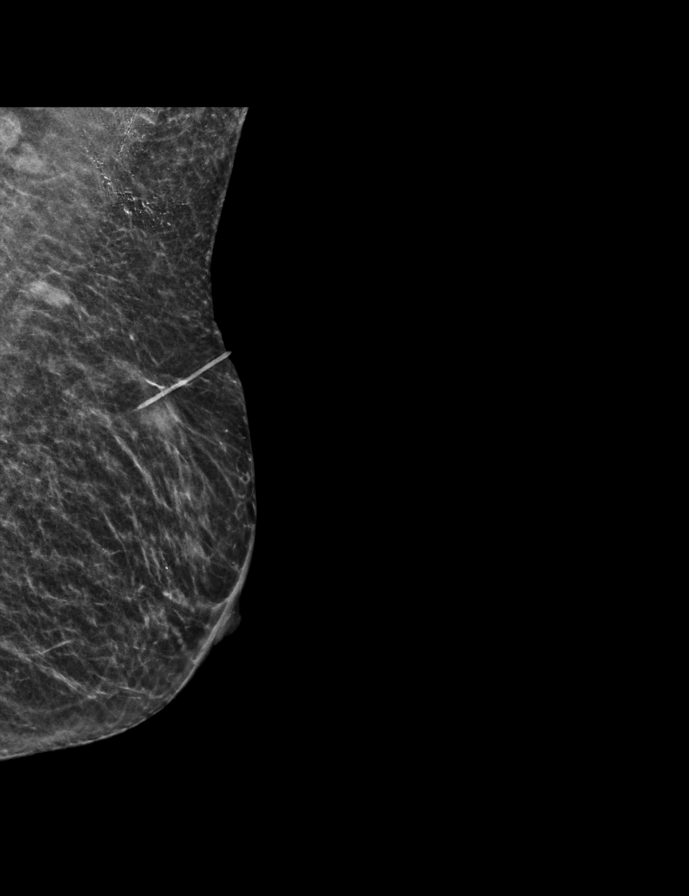

[L CC synth-2D]
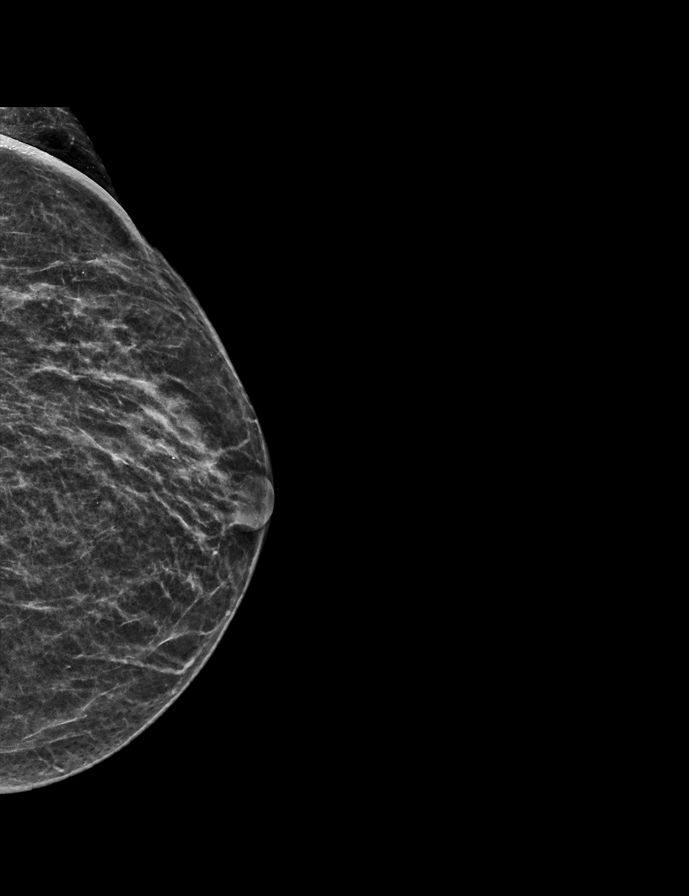

[R MLO tomo · 2 of 46 frames shown]
[frame 15/46]
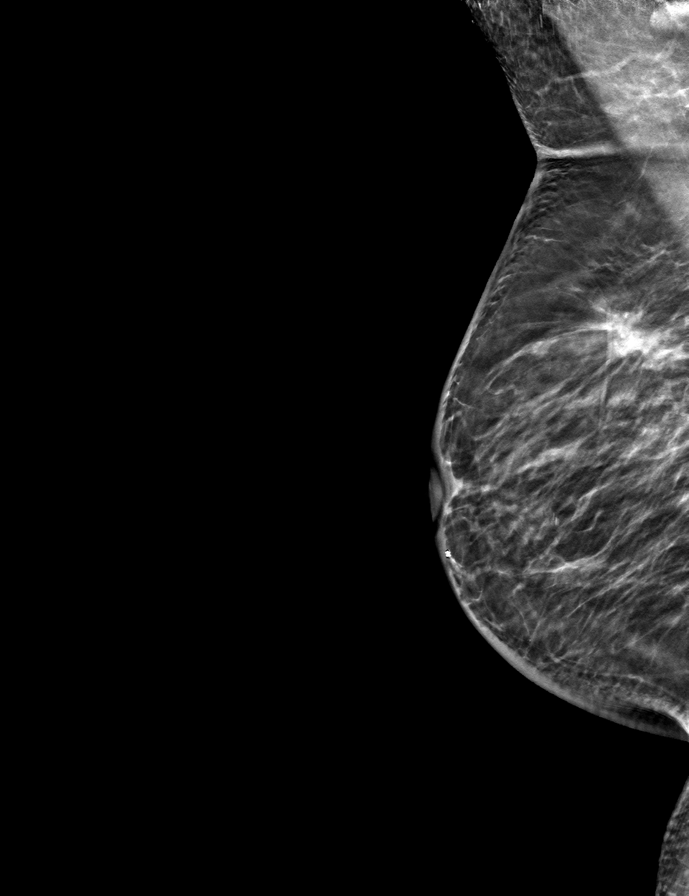
[frame 23/46]
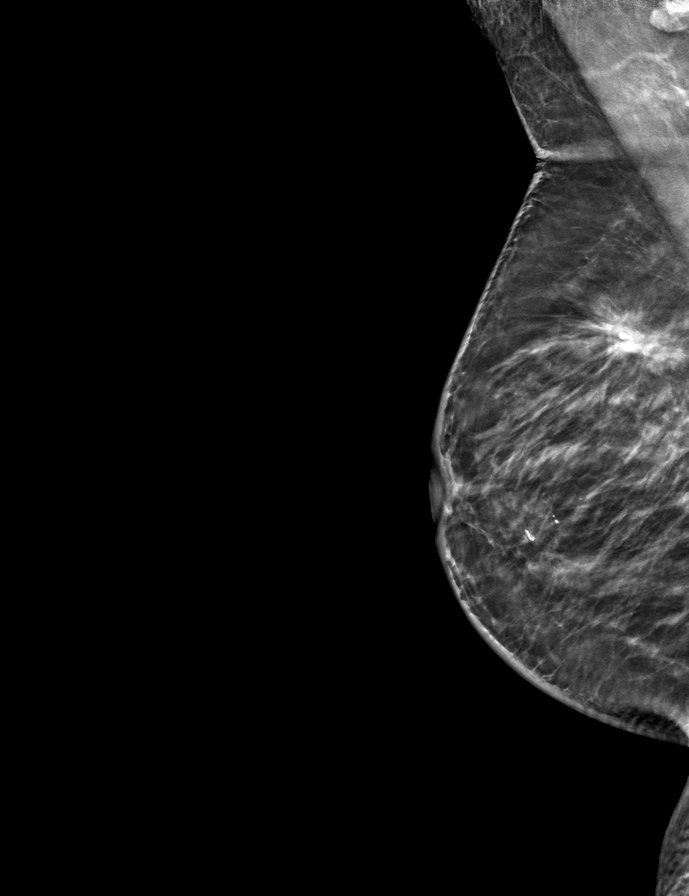

[L MLO tomo · tomo slice 21/42.0]
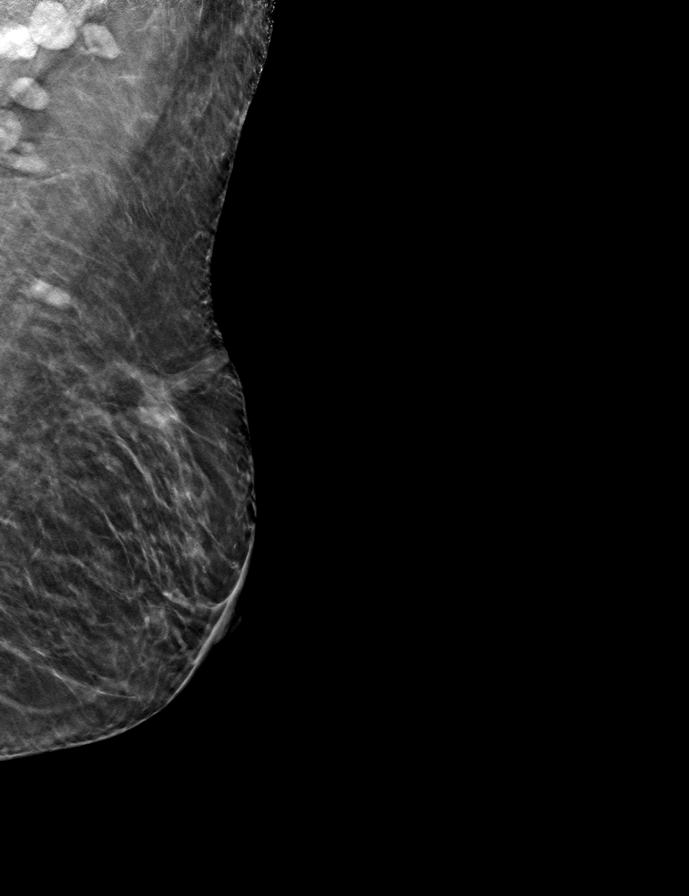

[L CC tomo · tomo slice 19/36.0]
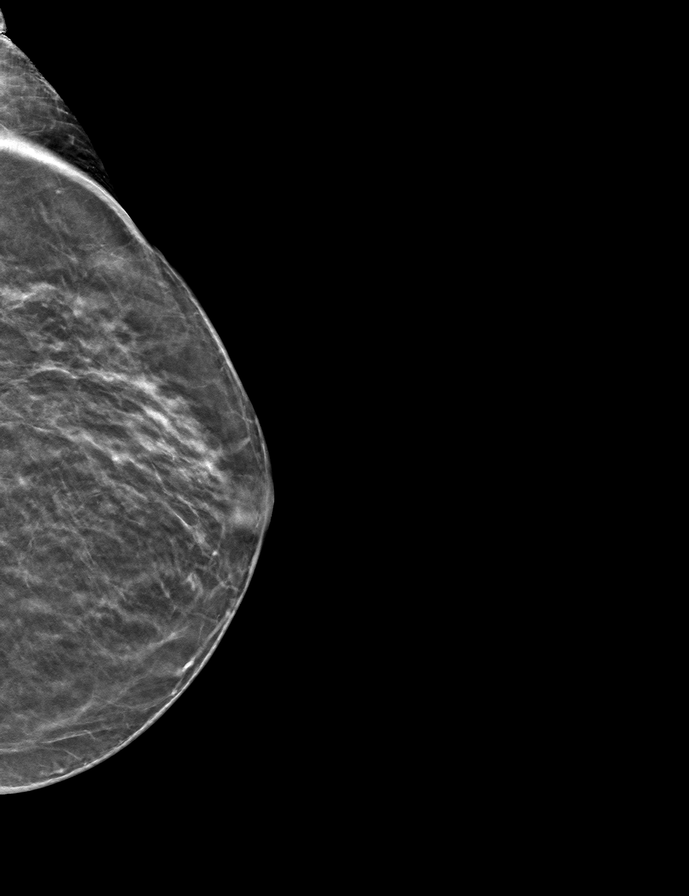

[R CC tomo · tomo slice 21/41.0]
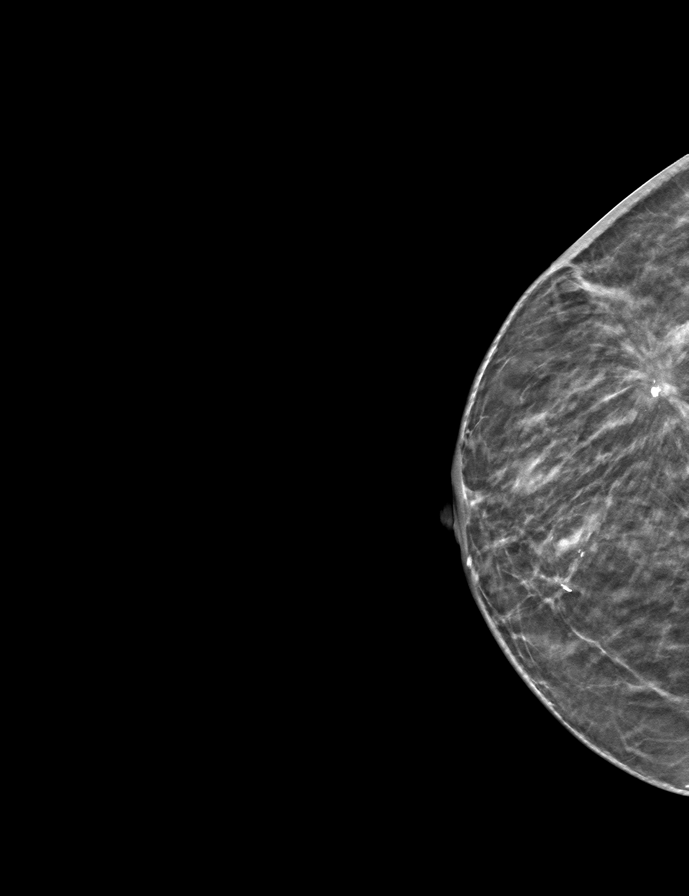

[9 of 24 positions shown; findings below may reference images not displayed]

ACR Breast Density Category b: There are scattered areas of
fibroglandular density.
FINDINGS: There are no findings suspicious for malignancy.
IMPRESSION: No mammographic evidence of malignancy. A result letter of this
screening mammogram will be mailed directly to the patient.

RECOMMENDATION:
Screening mammogram in one year. (Code:51-O-LD2)

BI-RADS CATEGORY  1: Negative.

## 2023-04-25 ENCOUNTER — Encounter: Payer: Self-pay | Admitting: Family Medicine

## 2023-04-25 ENCOUNTER — Other Ambulatory Visit: Payer: Self-pay | Admitting: Surgery

## 2023-04-25 DIAGNOSIS — Z1231 Encounter for screening mammogram for malignant neoplasm of breast: Secondary | ICD-10-CM

## 2023-04-25 DIAGNOSIS — Z0000 Encounter for general adult medical examination without abnormal findings: Secondary | ICD-10-CM

## 2023-05-29 ENCOUNTER — Inpatient Hospital Stay: Payer: Medicare PPO | Attending: Hematology and Oncology | Admitting: Hematology and Oncology

## 2023-05-29 VITALS — BP 126/48 | HR 90 | Temp 97.8°F | Resp 16 | Wt 115.1 lb

## 2023-05-29 DIAGNOSIS — Z7984 Long term (current) use of oral hypoglycemic drugs: Secondary | ICD-10-CM | POA: Insufficient documentation

## 2023-05-29 DIAGNOSIS — N951 Menopausal and female climacteric states: Secondary | ICD-10-CM | POA: Diagnosis not present

## 2023-05-29 DIAGNOSIS — Z170 Estrogen receptor positive status [ER+]: Secondary | ICD-10-CM | POA: Diagnosis not present

## 2023-05-29 DIAGNOSIS — Z1721 Progesterone receptor positive status: Secondary | ICD-10-CM | POA: Insufficient documentation

## 2023-05-29 DIAGNOSIS — Z1732 Human epidermal growth factor receptor 2 negative status: Secondary | ICD-10-CM | POA: Diagnosis not present

## 2023-05-29 DIAGNOSIS — E119 Type 2 diabetes mellitus without complications: Secondary | ICD-10-CM | POA: Diagnosis not present

## 2023-05-29 DIAGNOSIS — C50411 Malignant neoplasm of upper-outer quadrant of right female breast: Secondary | ICD-10-CM | POA: Diagnosis not present

## 2023-05-29 DIAGNOSIS — Z79810 Long term (current) use of selective estrogen receptor modulators (SERMs): Secondary | ICD-10-CM | POA: Diagnosis not present

## 2023-05-29 MED ORDER — TAMOXIFEN CITRATE 10 MG PO TABS: 10.0000 mg | ORAL_TABLET | Freq: Two times a day (BID) | ORAL | 3 refills | Status: AC

## 2023-05-29 NOTE — Assessment & Plan Note (Signed)
 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 Breast exam done by her gynecologist and at the breast center were normal  , patient does not like us  to do breast exams 2. Mammograms 07/17/2022: Benign, breast density category B   Diabetes: Patient tells me that she will continue to eat sugary foods   Tremors: Essential tremor: No active treatment needed.    Return to clinic in 1 year for follow-up

## 2023-05-29 NOTE — Progress Notes (Signed)
 No care team member to display  DIAGNOSIS:  Encounter Diagnosis  Name Primary?   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, ER 100%, PR 100%, K6 is on 15%, HER-2 negative   06/10/2012 Breast MRI   Breast MRI showing 1.4 cm mass upper outer quadrant right breast   06/27/2012 Surgery   Right breast partial mastectomy and SLN biopsy: 0.9 cm, grade 1, IDC with DCIS, 2 SLN negative   08/05/2012 - 08/26/2012 Radiation Therapy   Adjuvant radiation therapy   09/26/2012 -  Anti-estrogen oral therapy   Tamoxifen  20 mg daily     CHIEF COMPLIANT: Surveillance of breast cancer  HISTORY OF PRESENT ILLNESS:   History of Present Illness Kathleen Harris is a 78 year old female with a history of breast cancer who presents for follow-up regarding tamoxifen  use and associated symptoms.  She has been taking tamoxifen  for over eleven years and experiences significant side effects, including hot flashes and night sweats. She currently takes tamoxifen  20 mg in the morning. She experiences occasional sharp jabs of pain in the breast that was previously cancerous. She recalls receiving three injections during a mammogram two years ago, which she found distressing. She has a history of diabetes and is currently taking Tradjenta. She mentions her blood sugar levels rise occasionally. She enjoys [employer] dark chocolate squares, consuming a few pieces occasionally.     ALLERGIES:  is allergic to latex, codeine, effexor  [venlafaxine ], penicillins, and sulfonamide derivatives.  MEDICATIONS:  Current Outpatient Medications  Medication Sig Dispense Refill   tamoxifen  (NOLVADEX ) 10 MG tablet Take 1 tablet (10 mg total) by  mouth 2 (two) times daily. 90 tablet 3   aspirin 81 MG tablet Take 81 mg by mouth daily.     Cholecalciferol (VITAMIN D3) 50 MCG (2000 UT) capsule Take 1 capsule (2,000 Units total) by mouth daily. 100 capsule 3   diazepam  (VALIUM ) 5 MG tablet Take 1-2 tablets (5-10 mg total) by mouth every 12 (twelve) hours as needed for anxiety (vertigo). 60 tablet 1   metFORMIN  (GLUCOPHAGE ) 500 MG tablet Take 2 tablets (1,000 mg total) by mouth 2 (two) times daily with a meal. TAKE 1 TABLET BY MOUTH TWICE DAILY WITH A  MEAL 180 tablet 3   saw palmetto  80 MG capsule Take 1 capsule (80 mg total) by mouth 2 (two) times daily.     No current facility-administered medications for this visit.    PHYSICAL EXAMINATION: ECOG PERFORMANCE STATUS: 1 - Symptomatic but completely ambulatory  Vitals:   05/29/23 1017  BP: (!) 126/48  Pulse: 90  Resp: 16  Temp: 97.8 F (36.6 C)  SpO2: 97%   Filed Weights   05/29/23 1017  Weight: 115 lb 1.6 oz (52.2 kg)    Physical Exam MEASUREMENTS: Weight- 110.    LABORATORY DATA:  I have reviewed the data as listed    Latest Ref Rng & Units 01/30/2018    6:00 PM 04/27/2017    8:15 AM 01/03/2016    9:07 AM  CMP  Glucose 70 - 99 mg/dL 846  962  952   BUN 8 - 23 mg/dL 10  16  12    Creatinine 0.44 - 1.00 mg/dL 8.41  3.24  4.01  Sodium 135 - 145 mmol/L 138  139  140   Potassium 3.5 - 5.1 mmol/L 3.8  3.9  4.1   Chloride 98 - 111 mmol/L 103  102  102   CO2 22 - 32 mmol/L 25  29  29    Calcium 8.9 - 10.3 mg/dL 16.1  9.8  09.6   Total Protein 6.5 - 8.1 g/dL 5.8  6.4  6.7   Total Bilirubin 0.3 - 1.2 mg/dL 0.4  0.5  0.6   Alkaline Phos 38 - 126 U/L 39  55  53   AST 15 - 41 U/L 22  16  18    ALT 0 - 44 U/L 13  14  17      Lab Results  Component Value Date   WBC 12.5 (H) 01/30/2018   HGB 12.5 01/30/2018   HCT 37.8 01/30/2018   MCV 93.6 01/30/2018   PLT 235 01/30/2018   NEUTROABS 8.7 (H) 01/30/2018    ASSESSMENT & PLAN:  Breast cancer of upper-outer quadrant of right  female breast ([facility]) 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   Breast cancer surveillance: 1. Breast exam done by her gynecologist and at the breast center were normal  , patient does not like us  to do breast exams 2. Mammograms 07/17/2022: Benign, breast density category B   Diabetes: Follows with her primary care physician.    Return to clinic in 1 year for follow-up ------------------------------------- Assessment and Plan Assessment & Plan Malignant neoplasm of upper-outer quadrant of right breast 11 years post-diagnosis of ER-positive breast cancer. Low recurrence risk. Experiencing significant tamoxifen  side effects. Discussed dose reduction to alleviate side effects while maintaining efficacy. - Reduce tamoxifen  dose to 10 mg daily. - Send new prescription to [facility] for 90 tablets of tamoxifen  10 mg.  Type 2 diabetes mellitus Managed with Tradjenta. Reluctant to monitor blood glucose. Occasionally consumes sweets but maintains healthy weight. - Continue Tradjenta. - Discuss dietary habits and encourage low-sugar diet.      No orders of the defined types were placed in this encounter.  The patient has a good understanding of the overall plan. she agrees with it. she will call with any problems that may develop before the next visit here. Total time spent: 30 mins including face to face time and time spent for planning, charting and co-ordination of care   Margert Sheerer, MD 05/29/23

## 2023-06-20 DIAGNOSIS — E119 Type 2 diabetes mellitus without complications: Secondary | ICD-10-CM | POA: Diagnosis not present

## 2023-06-20 DIAGNOSIS — Z0000 Encounter for general adult medical examination without abnormal findings: Secondary | ICD-10-CM | POA: Diagnosis not present

## 2023-06-26 DIAGNOSIS — Z0000 Encounter for general adult medical examination without abnormal findings: Secondary | ICD-10-CM | POA: Diagnosis not present

## 2023-06-26 DIAGNOSIS — C50911 Malignant neoplasm of unspecified site of right female breast: Secondary | ICD-10-CM | POA: Diagnosis not present

## 2023-06-26 DIAGNOSIS — N390 Urinary tract infection, site not specified: Secondary | ICD-10-CM | POA: Diagnosis not present

## 2023-06-26 DIAGNOSIS — E1165 Type 2 diabetes mellitus with hyperglycemia: Secondary | ICD-10-CM | POA: Diagnosis not present

## 2023-06-26 DIAGNOSIS — R251 Tremor, unspecified: Secondary | ICD-10-CM | POA: Diagnosis not present

## 2023-07-16 ENCOUNTER — Ambulatory Visit
Admission: RE | Admit: 2023-07-16 | Discharge: 2023-07-16 | Disposition: A | Discharge status: Home / Self Care | Source: Ambulatory Visit | Attending: Surgery | Admitting: Surgery

## 2023-07-16 DIAGNOSIS — Z1231 Encounter for screening mammogram for malignant neoplasm of breast: Secondary | ICD-10-CM

## 2023-07-23 ENCOUNTER — Other Ambulatory Visit: Payer: Self-pay | Admitting: Surgery

## 2023-07-23 DIAGNOSIS — N644 Mastodynia: Secondary | ICD-10-CM

## 2023-09-27 DIAGNOSIS — H53143 Visual discomfort, bilateral: Secondary | ICD-10-CM | POA: Diagnosis not present

## 2023-09-27 DIAGNOSIS — D3132 Benign neoplasm of left choroid: Secondary | ICD-10-CM | POA: Diagnosis not present

## 2023-09-27 DIAGNOSIS — E119 Type 2 diabetes mellitus without complications: Secondary | ICD-10-CM | POA: Diagnosis not present

## 2023-09-27 DIAGNOSIS — H524 Presbyopia: Secondary | ICD-10-CM | POA: Diagnosis not present

## 2023-11-22 DIAGNOSIS — H18413 Arcus senilis, bilateral: Secondary | ICD-10-CM | POA: Diagnosis not present

## 2023-11-22 DIAGNOSIS — H25043 Posterior subcapsular polar age-related cataract, bilateral: Secondary | ICD-10-CM | POA: Diagnosis not present

## 2023-11-22 DIAGNOSIS — H25013 Cortical age-related cataract, bilateral: Secondary | ICD-10-CM | POA: Diagnosis not present

## 2023-11-22 DIAGNOSIS — H2511 Age-related nuclear cataract, right eye: Secondary | ICD-10-CM | POA: Diagnosis not present

## 2023-11-22 DIAGNOSIS — H2513 Age-related nuclear cataract, bilateral: Secondary | ICD-10-CM | POA: Diagnosis not present

## 2024-05-29 ENCOUNTER — Ambulatory Visit: Admitting: Hematology and Oncology
